# Patient Record
Sex: Male | Born: 1950 | Race: White | Hispanic: No | Marital: Married | Smoking: Current every day smoker
Health system: Southern US, Community
[De-identification: ages and names within clinical notes are randomized; demographics above are authoritative.]

## PROBLEM LIST (undated history)

## (undated) DIAGNOSIS — A0472 Enterocolitis due to Clostridium difficile, not specified as recurrent: Secondary | ICD-10-CM

## (undated) DIAGNOSIS — Z720 Tobacco use: Secondary | ICD-10-CM

## (undated) DIAGNOSIS — F1020 Alcohol dependence, uncomplicated: Secondary | ICD-10-CM

## (undated) HISTORY — PX: APPENDECTOMY: SHX54

## (undated) HISTORY — PX: ABDOMINAL SURGERY: SHX537

## (undated) HISTORY — PX: HERNIA REPAIR: SHX51

---

## 2012-11-01 ENCOUNTER — Encounter (HOSPITAL_COMMUNITY): Payer: Self-pay | Admitting: Emergency Medicine

## 2012-11-01 ENCOUNTER — Inpatient Hospital Stay (HOSPITAL_COMMUNITY): Payer: Self-pay

## 2012-11-01 ENCOUNTER — Inpatient Hospital Stay (HOSPITAL_COMMUNITY)
Admission: EM | Admit: 2012-11-01 | Discharge: 2012-11-03 | DRG: 372 | Disposition: A | Discharge status: Home / Self Care | Payer: MEDICAID | Attending: Internal Medicine | Admitting: Internal Medicine

## 2012-11-01 DIAGNOSIS — F17200 Nicotine dependence, unspecified, uncomplicated: Secondary | ICD-10-CM | POA: Diagnosis present

## 2012-11-01 DIAGNOSIS — F1020 Alcohol dependence, uncomplicated: Secondary | ICD-10-CM

## 2012-11-01 DIAGNOSIS — R197 Diarrhea, unspecified: Secondary | ICD-10-CM

## 2012-11-01 DIAGNOSIS — A0472 Enterocolitis due to Clostridium difficile, not specified as recurrent: Principal | ICD-10-CM

## 2012-11-01 DIAGNOSIS — E869 Volume depletion, unspecified: Secondary | ICD-10-CM | POA: Diagnosis present

## 2012-11-01 DIAGNOSIS — Z79899 Other long term (current) drug therapy: Secondary | ICD-10-CM

## 2012-11-01 DIAGNOSIS — D72829 Elevated white blood cell count, unspecified: Secondary | ICD-10-CM

## 2012-11-01 DIAGNOSIS — E860 Dehydration: Secondary | ICD-10-CM

## 2012-11-01 DIAGNOSIS — I959 Hypotension, unspecified: Secondary | ICD-10-CM

## 2012-11-01 DIAGNOSIS — Z720 Tobacco use: Secondary | ICD-10-CM | POA: Diagnosis present

## 2012-11-01 DIAGNOSIS — E871 Hypo-osmolality and hyponatremia: Secondary | ICD-10-CM

## 2012-11-01 HISTORY — DX: Alcohol dependence, uncomplicated: F10.20

## 2012-11-01 HISTORY — DX: Tobacco use: Z72.0

## 2012-11-01 LAB — CBC WITH DIFFERENTIAL/PLATELET
Basophils Absolute: 0 10*3/uL (ref 0.0–0.1)
Eosinophils Relative: 0 % (ref 0–5)
HCT: 41.7 % (ref 39.0–52.0)
Hemoglobin: 14.7 g/dL (ref 13.0–17.0)
Lymphocytes Relative: 5 % — ABNORMAL LOW (ref 12–46)
Lymphs Abs: 1.1 10*3/uL (ref 0.7–4.0)
MCV: 93.3 fL (ref 78.0–100.0)
Monocytes Absolute: 1.9 10*3/uL — ABNORMAL HIGH (ref 0.1–1.0)
Neutro Abs: 21.5 10*3/uL — ABNORMAL HIGH (ref 1.7–7.7)
RBC: 4.47 MIL/uL (ref 4.22–5.81)
RDW: 12 % (ref 11.5–15.5)
WBC: 24.6 10*3/uL — ABNORMAL HIGH (ref 4.0–10.5)

## 2012-11-01 LAB — COMPREHENSIVE METABOLIC PANEL
ALT: 10 U/L (ref 0–53)
AST: 22 U/L (ref 0–37)
CO2: 22 mEq/L (ref 19–32)
Chloride: 89 mEq/L — ABNORMAL LOW (ref 96–112)
Creatinine, Ser: 0.85 mg/dL (ref 0.50–1.35)
GFR calc Af Amer: >90 mL/min (ref >90)
GFR calc non Af Amer: >90 mL/min (ref >90)
Glucose, Bld: 129 mg/dL — ABNORMAL HIGH (ref 70–99)
Total Bilirubin: 0.3 mg/dL (ref 0.3–1.2)

## 2012-11-01 LAB — CREATININE, SERUM
Creatinine, Ser: 0.73 mg/dL (ref 0.50–1.35)
GFR calc non Af Amer: >90 mL/min (ref >90)

## 2012-11-01 LAB — URINALYSIS, ROUTINE W REFLEX MICROSCOPIC
Glucose, UA: NEGATIVE mg/dL
Hgb urine dipstick: NEGATIVE
Leukocytes, UA: NEGATIVE
pH: 6 (ref 5.0–8.0)

## 2012-11-01 LAB — CBC
Hemoglobin: 12.8 g/dL — ABNORMAL LOW (ref 13.0–17.0)
Platelets: 221 10*3/uL (ref 150–400)
RBC: 3.86 MIL/uL — ABNORMAL LOW (ref 4.22–5.81)

## 2012-11-01 MED ORDER — LORAZEPAM 1 MG PO TABS
1.0000 mg | ORAL_TABLET | Freq: Four times a day (QID) | ORAL | Status: DC | PRN
  Administered 2012-11-02: 1 mg via ORAL
  Filled 2012-11-01: qty 1

## 2012-11-01 MED ORDER — ONDANSETRON HCL 4 MG PO TABS: 4.0000 mg | ORAL_TABLET | Freq: Four times a day (QID) | ORAL | Status: DC | PRN

## 2012-11-01 MED ORDER — THIAMINE HCL 100 MG/ML IJ SOLN
100.0000 mg | Freq: Every day | INTRAMUSCULAR | Status: DC
  Filled 2012-11-01 (×2): qty 1

## 2012-11-01 MED ORDER — ADULT MULTIVITAMIN W/MINERALS CH
1.0000 | ORAL_TABLET | Freq: Every day | ORAL | Status: DC
  Administered 2012-11-01 – 2012-11-03 (×3): 1 via ORAL
  Filled 2012-11-01 (×3): qty 1

## 2012-11-01 MED ORDER — SODIUM CHLORIDE 0.9 % IV BOLUS (SEPSIS)
1000.0000 mL | Freq: Once | INTRAVENOUS | Status: AC
  Administered 2012-11-01: 1000 mL via INTRAVENOUS

## 2012-11-01 MED ORDER — FOLIC ACID 1 MG PO TABS
1.0000 mg | ORAL_TABLET | Freq: Every day | ORAL | Status: DC
  Administered 2012-11-01 – 2012-11-03 (×3): 1 mg via ORAL
  Filled 2012-11-01 (×3): qty 1

## 2012-11-01 MED ORDER — SODIUM CHLORIDE 0.9 % IV SOLN: INTRAVENOUS | Status: DC

## 2012-11-01 MED ORDER — THIAMINE HCL 100 MG/ML IJ SOLN: Freq: Once | INTRAVENOUS | Status: DC

## 2012-11-01 MED ORDER — ONDANSETRON HCL 4 MG/2ML IJ SOLN: 4.0000 mg | Freq: Four times a day (QID) | INTRAMUSCULAR | Status: DC | PRN

## 2012-11-01 MED ORDER — LORAZEPAM 1 MG PO TABS: 0.0000 mg | ORAL_TABLET | Freq: Two times a day (BID) | ORAL | Status: DC

## 2012-11-01 MED ORDER — ACETAMINOPHEN 650 MG RE SUPP: 650.0000 mg | Freq: Four times a day (QID) | RECTAL | Status: DC | PRN

## 2012-11-01 MED ORDER — METRONIDAZOLE 500 MG PO TABS
500.0000 mg | ORAL_TABLET | Freq: Three times a day (TID) | ORAL | Status: DC
  Administered 2012-11-01 – 2012-11-03 (×7): 500 mg via ORAL
  Filled 2012-11-01 (×9): qty 1

## 2012-11-01 MED ORDER — VITAMIN B-1 100 MG PO TABS
100.0000 mg | ORAL_TABLET | Freq: Every day | ORAL | Status: DC
  Administered 2012-11-01 – 2012-11-03 (×3): 100 mg via ORAL
  Filled 2012-11-01 (×3): qty 1

## 2012-11-01 MED ORDER — ACETAMINOPHEN 325 MG PO TABS: 650.0000 mg | ORAL_TABLET | Freq: Four times a day (QID) | ORAL | Status: DC | PRN

## 2012-11-01 MED ORDER — SACCHAROMYCES BOULARDII 250 MG PO CAPS
250.0000 mg | ORAL_CAPSULE | Freq: Two times a day (BID) | ORAL | Status: DC
  Administered 2012-11-01 – 2012-11-03 (×4): 250 mg via ORAL
  Filled 2012-11-01 (×6): qty 1

## 2012-11-01 MED ORDER — CIPROFLOXACIN IN D5W 400 MG/200ML IV SOLN
400.0000 mg | Freq: Two times a day (BID) | INTRAVENOUS | Status: DC
  Administered 2012-11-01 – 2012-11-02 (×2): 400 mg via INTRAVENOUS
  Filled 2012-11-01 (×2): qty 200

## 2012-11-01 MED ORDER — ENOXAPARIN SODIUM 40 MG/0.4ML ~~LOC~~ SOLN
40.0000 mg | SUBCUTANEOUS | Status: DC
  Administered 2012-11-01 – 2012-11-02 (×2): 40 mg via SUBCUTANEOUS
  Filled 2012-11-01 (×4): qty 0.4

## 2012-11-01 MED ORDER — SODIUM CHLORIDE 0.9 % IV SOLN
INTRAVENOUS | Status: DC
  Administered 2012-11-01: 13:00:00 via INTRAVENOUS

## 2012-11-01 MED ORDER — LORAZEPAM 2 MG/ML IJ SOLN: 1.0000 mg | Freq: Four times a day (QID) | INTRAMUSCULAR | Status: DC | PRN

## 2012-11-01 MED ORDER — OXYCODONE HCL 5 MG PO TABS: 5.0000 mg | ORAL_TABLET | ORAL | Status: DC | PRN

## 2012-11-01 MED ORDER — NICOTINE 21 MG/24HR TD PT24
21.0000 mg | MEDICATED_PATCH | Freq: Every day | TRANSDERMAL | Status: DC
  Administered 2012-11-01 – 2012-11-03 (×3): 21 mg via TRANSDERMAL
  Filled 2012-11-01 (×3): qty 1

## 2012-11-01 MED ORDER — LORAZEPAM 1 MG PO TABS
0.0000 mg | ORAL_TABLET | Freq: Four times a day (QID) | ORAL | Status: AC
  Administered 2012-11-01: 2 mg via ORAL
  Administered 2012-11-02: 4 mg via ORAL
  Filled 2012-11-01: qty 4
  Filled 2012-11-01: qty 2

## 2012-11-01 NOTE — H&P (Signed)
[facility] History and Physical  Kevin Shea WUJ:[id number] DOB: October 16, 1950 DOA: 11/01/2012  Referring physician: Dr Denton Lank PCP: No primary provider on file.  Specialists: None  Chief Complaint: Diarrhea  HPI: Kevin Shea is a 62 y.o. male with no significant past medical history except history of tobacco dependence who presents to the ED with an 11 day history of copious numerous amounts of diarrhea over 10 stools per day. Patient states 2 weeks prior to admission had upper rest her symptoms including fevers chills myalgias cough runny nose and that he may have had the flu also developed diarrhea at that time. Patient stated that his upper respiratory symptoms subsequently resolved however diarrhea has been continuous and copious. Patient describes the diarrhea as watery loose stools, malodourous, greenish, nonbloody. Patient does endorse subjective fevers, chills, dehydration, generalized weakness. Patient denies any chest pain, no shortness of breath, no nausea, no vomiting, no constipation, no abdominal pain, no dysuria. Patient stated that prior to onset of symptoms approximately 2 weeks prior to admission had some right-sided jaw swelling took 2 days worth of antibiotics that was given to him by a relative. Patient states that right-sided jaw swelling subsequently resolved. Due to patient's ongoing diarrhea he presented to the ED. In the ED comprehensive metabolic profile obtained these show a sodium of 125 a glucose of 129 albumin level of 3.2 otherwise was within normal limits. CBC had a white count of 24.6 otherwise was within normal limits. Patient also noted to have borderline blood pressures in the ED. Stool studies were ordered and are pending. We were called to admit the patient for further evaluation and management.    Review of Systems: The patient denies anorexia, fever, weight loss,, vision loss, decreased hearing, hoarseness, chest pain, syncope, dyspnea on exertion,  peripheral edema, balance deficits, hemoptysis, abdominal pain, melena, hematochezia, severe indigestion/heartburn, hematuria, incontinence, genital sores, muscle weakness, suspicious skin lesions, transient blindness, difficulty walking, depression, unusual weight change, abnormal bleeding, enlarged lymph nodes, angioedema, and breast masses.    Past Medical History  Diagnosis Date  . Tobacco abuse 11/01/2012  . EtOH dependence 11/01/2012   Past Surgical History  Procedure Date  . Abdominal surgery   . Hernia repair    Social History:  reports that he has been smoking Cigarettes.  He has been smoking about 1.5 packs per day. His smokeless tobacco use includes Chew. He reports that he drinks about 50.4 ounces of alcohol per week. He reports that he uses illicit drugs (Marijuana) about 3 times per week.  No Known Allergies  History reviewed. No pertinent family history.   Prior to Admission medications   Medication Sig Start Date End Date Taking? Authorizing Provider  loperamide (IMODIUM A-D) 2 MG tablet Take 2 mg by mouth 4 (four) times daily as needed. DIARRHEA   Yes Historical Provider, MD   Physical Exam: Filed Vitals:   11/01/12 0758 11/01/12 0811  BP: 98/69   Pulse: 105   Temp: 98.3 F (36.8 C)   TempSrc: Oral   Resp: 17   Weight:  55.509 kg (122 lb 6 oz)  SpO2: 95%      General:  Thin. Well-developed well-nourished in no acute cardiopulmonary distress.  Eyes: Pupils equal round and reactive to light and accommodation. Extraocular movements intact.  ENT:  Oropharynx is clear, no lesions, no exudates. Dry mucous membranes.  Neck: Supple with no lymphadenopathy.  Cardiovascular: Regular rate rhythm no murmurs rubs or gallops  Respiratory: Clear to auscultation bilaterally. No wheezing,  no crackles, no rhonchi.  Abdomen: Soft, nontender, nondistended, positive bowel sounds.  Skin: Patient noted to have some circular dry eczematous looking rash on his  back.  Musculoskeletal: 5 out of 5 bilateral pressure on discharge. 5 out of 5 bilateral lower extremity strength.  Psychiatric: Normal mood. Normal affect. Good insight. Good judgment.  Neurologic: Alert and oriented x3. Cranial nerves II through XII are grossly intact. No focal deficits. Sensation is intact. Gait not tested secondary to safety.  Labs on Admission:  Basic Metabolic Panel:  Lab 11/01/12 1610  NA 125*  K 3.7  CL 89*  CO2 22  GLUCOSE 129*  BUN 18  CREATININE 0.85  CALCIUM 9.0  MG --  PHOS --   Liver Function Tests:  Lab 11/01/12 0839  AST 22  ALT 10  ALKPHOS 65  BILITOT 0.3  PROT 7.2  ALBUMIN 3.2*   No results found for this basename: LIPASE:5,AMYLASE:5 in the last 168 hours No results found for this basename: AMMONIA:5 in the last 168 hours CBC:  Lab 11/01/12 0839  WBC 24.6*  NEUTROABS 21.5*  HGB 14.7  HCT 41.7  MCV 93.3  PLT 255   Cardiac Enzymes: No results found for this basename: CKTOTAL:5,CKMB:5,CKMBINDEX:5,TROPONINI:5 in the last 168 hours  BNP (last 3 results) No results found for this basename: PROBNP:3 in the last 8760 hours CBG: No results found for this basename: GLUCAP:5 in the last 168 hours  Radiological Exams on Admission: No results found.  EKG: None  Assessment/Plan Principal Problem:  *Diarrhea Active Problems:  Hyponatremia  Leukocytosis  Dehydration  Hypotension  Tobacco abuse  EtOH dependence  #1 diarrhea Patient is presenting with copious amounts of diarrhea x11 days which is malodorous, watery, greenish. 2 weeks prior to admission patient self medicated with antibiotics for right shoulder pain. Patient also noted to have some fever and chills at home. Patient does have a leukocytosis with a white count of 24.6. Will check blood cultures x2. Will check stool for C. difficile PCR, ova and parasites, enteric pathogens. Will hydrate with IV fluids. We'll place on IV ciprofloxacin and oral Flagyl while awaiting  stool culture results. Supportive care. Follow.  #2 hyponatremia Likely secondary to hypovolemic hyponatremia secondary to dehydration and GI losses versus beer potomania. Patient's chloride levels are low. Patient's blood pressure was borderline on presentation. Will check a urine sodium. Check a urine creatinine. Check a urine osmolality. Check a serum osmolality. Check a chest x-ray. Check a TSH.-Up with IV fluids. Follow.  #3 dehydration Secondary to GI losses. IV fluids.  #4 leukocytosis Likely secondary to problem #1. C. difficile PCR is pending. Stool cultures and stool for ova and parasites as well as enteric pathogens are pending. Will check blood cultures x2. Will check a chest x-ray. Urinalysis is pending. Will place empirically on IV ciprofloxacin and oral Flagyl. Follow.  #5 tobacco abuse Tobacco cessation. We'll place on a nicotine patch.  #6 alcohol dependence Patient Francetta Found were from 6-12 pack of beer per day. Will place patient on Ativan withdrawal protocol. Monitor for DTs. Place on folic acid daily as well as thiamine daily.  #7 borderline hypotension Likely secondary to volume depletion secondary to dehydration versus infectious etiology. Will check blood cultures x2. Stool studies are pending. Urinalysis is pending. Will check a lactic acid level. Blood pressure has responded to IV fluids. Hydrated with IV fluids. We'll place empirically on IV ciprofloxacin and oral Flagyl. Follow.  #8 prophylaxis Lovenox for DVT prophylaxis.  Code Status:  Full Family Communication: Updated patient and wife at bedside Disposition Plan: Admit to MedSurg  Time spent: 65 mins  [facility] Pager [phone]  If 7PM-7AM, please contact night-coverage [email or web address] Password [facility] 11/01/2012, 12:07 PM

## 2012-11-01 NOTE — ED Notes (Signed)
Pt states has the flu several wks ago and started having diarrhea following. States is liquid green, bile w/o any stomach pain or cramping. Pt states feels like he has to go all the time. Decreased appetite d/t this and tried OTC Imodium w/o any improvement.

## 2012-11-01 NOTE — ED Notes (Signed)
Pt provided with Ginger ale per order

## 2012-11-01 NOTE — ED Notes (Signed)
Pt aware of the need for stool sample. Unable to provide at this time

## 2012-11-01 NOTE — ED Provider Notes (Addendum)
History     CSN: [id number]  Arrival date & time 11/01/12  8295   First MD Initiated Contact with Patient 11/01/12 [phone]      Chief Complaint  Patient presents with  . Diarrhea    (Consider location/radiation/quality/duration/timing/severity/associated sxs/prior treatment) Patient is a 62 y.o. male presenting with diarrhea. The history is provided by the patient.  Diarrhea The primary symptoms include diarrhea. Primary symptoms do not include fever, abdominal pain, vomiting, dysuria or rash.  The illness does not include back pain.  pt with diarrhea for past 1-1.5 weeks. States at onset thought had 'stomach flu'. Had fever, body aches and diarrhea.  Fever and body aches have resolved, but diarrhea persists. 5-10 watery, sl greenish stools per day. No bloody bms. No abdominal pain or distension. No n/v. Normal appetite. No chills/sweats. No faintness or lightheadedness. No known ill contacts, recent travel, or bad food ingestion. Was on unspec abx for facial abscess earlier in month. No hx c diff.     History reviewed. No pertinent past medical history.  Past Surgical History  Procedure Date  . Abdominal surgery   . Hernia repair     No family history on file.  History  Substance Use Topics  . Smoking status: Current Every Day Smoker -- 1.5 packs/day    Types: Cigarettes  . Smokeless tobacco: Current User    Types: Chew  . Alcohol Use: 50.4 oz/week    84 Cans of beer per week     Comment: 6 pk 12 oz cans daily      Review of Systems  Constitutional: Negative for fever.  HENT: Negative for sore throat and neck pain.   Eyes: Negative for redness.  Respiratory: Negative for shortness of breath.   Cardiovascular: Negative for chest pain.  Gastrointestinal: Positive for diarrhea. Negative for vomiting and abdominal pain.  Genitourinary: Negative for dysuria and flank pain.  Musculoskeletal: Negative for back pain.  Skin: Negative for rash.  Neurological: Negative for  headaches.  Hematological: Does not bruise/bleed easily.  Psychiatric/Behavioral: Negative for confusion.    Allergies  Review of patient's allergies indicates not on file.  Home Medications  No current outpatient prescriptions on file.  BP 98/69  Pulse 105  Temp 98.3 F (36.8 C) (Oral)  Resp 17  Wt 122 lb 6 oz (55.509 kg)  SpO2 95%  Physical Exam  Nursing note and vitals reviewed. Constitutional: He is oriented to person, place, and time. He appears well-developed and well-nourished. No distress.  HENT:  Head: Atraumatic.  Nose: Nose normal.  Mouth/Throat: Oropharynx is clear and moist.  Eyes: Conjunctivae normal are normal. Pupils are equal, round, and reactive to light. No scleral icterus.  Neck: Neck supple. No tracheal deviation present. No thyromegaly present.  Cardiovascular: Normal rate, regular rhythm, normal heart sounds and intact distal pulses.   Pulmonary/Chest: Effort normal and breath sounds normal. No accessory muscle usage. No respiratory distress.  Abdominal: Soft. Bowel sounds are normal. He exhibits no distension and no mass. There is no tenderness. There is no rebound and no guarding.  Musculoskeletal: Normal range of motion. He exhibits no edema and no tenderness.  Neurological: He is alert and oriented to person, place, and time.  Skin: Skin is warm and dry. He is not diaphoretic.  Psychiatric: He has a normal mood and affect.    ED Course  Procedures (including critical care time)   Results for orders placed during the hospital encounter of 11/01/12  CBC WITH DIFFERENTIAL  Component Value Range   WBC 24.6 (*) 4.0 - 10.5 K/uL   RBC 4.47  4.22 - 5.81 MIL/uL   Hemoglobin 14.7  13.0 - 17.0 g/dL   HCT 16.1  09.6 - 04.5 %   MCV 93.3  78.0 - 100.0 fL   MCH 32.9  26.0 - 34.0 pg   MCHC 35.3  30.0 - 36.0 g/dL   RDW 40.9  81.1 - 91.4 %   Platelets 255  150 - 400 K/uL   Neutrophils Relative 87 (*) 43 - 77 %   Neutro Abs 21.5 (*) 1.7 - 7.7 K/uL    Lymphocytes Relative 5 (*) 12 - 46 %   Lymphs Abs 1.1  0.7 - 4.0 K/uL   Monocytes Relative 8  3 - 12 %   Monocytes Absolute 1.9 (*) 0.1 - 1.0 K/uL   Eosinophils Relative 0  0 - 5 %   Eosinophils Absolute 0.0  0.0 - 0.7 K/uL   Basophils Relative 0  0 - 1 %   Basophils Absolute 0.0  0.0 - 0.1 K/uL  COMPREHENSIVE METABOLIC PANEL      Component Value Range   Sodium 125 (*) 135 - 145 mEq/L   Potassium 3.7  3.5 - 5.1 mEq/L   Chloride 89 (*) 96 - 112 mEq/L   CO2 22  19 - 32 mEq/L   Glucose, Bld 129 (*) 70 - 99 mg/dL   BUN 18  6 - 23 mg/dL   Creatinine, Ser 7.82  0.50 - 1.35 mg/dL   Calcium 9.0  8.4 - 95.6 mg/dL   Total Protein 7.2  6.0 - 8.3 g/dL   Albumin 3.2 (*) 3.5 - 5.2 g/dL   AST 22  0 - 37 U/L   ALT 10  0 - 53 U/L   Alkaline Phosphatase 65  39 - 117 U/L   Total Bilirubin 0.3  0.3 - 1.2 mg/dL   GFR calc non Af Amer >90  >90 mL/min   GFR calc Af Amer >90  >90 mL/min      MDM  Po fluids. Labs.  Reviewed nursing notes and prior charts for additional history.    Pt with hyponatremia, na 125.   Additional ns iv.  Wbc elevated. abd soft nt.   cdiff and stool cx pending.  Given hyponatremia, dehydration, will admit.   [facility] obs temp orders, team 5, medsurg, 5 east preferred    Suzi Roots, MD 11/01/12 1051  Suzi Roots, MD 11/01/12 1115

## 2012-11-02 DIAGNOSIS — A0472 Enterocolitis due to Clostridium difficile, not specified as recurrent: Secondary | ICD-10-CM

## 2012-11-02 DIAGNOSIS — E860 Dehydration: Secondary | ICD-10-CM

## 2012-11-02 HISTORY — DX: Enterocolitis due to Clostridium difficile, not specified as recurrent: A04.72

## 2012-11-02 LAB — OSMOLALITY, URINE: Osmolality, Ur: 553 mOsm/kg (ref 390–1090)

## 2012-11-02 LAB — COMPREHENSIVE METABOLIC PANEL
AST: 16 U/L (ref 0–37)
Albumin: 2.2 g/dL — ABNORMAL LOW (ref 3.5–5.2)
BUN: 7 mg/dL (ref 6–23)
CO2: 24 mEq/L (ref 19–32)
Calcium: 7.7 mg/dL — ABNORMAL LOW (ref 8.4–10.5)
Chloride: 100 mEq/L (ref 96–112)
Creatinine, Ser: 0.66 mg/dL (ref 0.50–1.35)
GFR calc non Af Amer: >90 mL/min (ref >90)
Total Bilirubin: 0.2 mg/dL — ABNORMAL LOW (ref 0.3–1.2)

## 2012-11-02 LAB — CREATININE, URINE, RANDOM: Creatinine, Urine: 121.2 mg/dL

## 2012-11-02 LAB — CBC WITH DIFFERENTIAL/PLATELET
Hemoglobin: 11.9 g/dL — ABNORMAL LOW (ref 13.0–17.0)
Lymphocytes Relative: 7 % — ABNORMAL LOW (ref 12–46)
Lymphs Abs: 1 10*3/uL (ref 0.7–4.0)
Monocytes Relative: 8 % (ref 3–12)
Neutrophils Relative %: 83 % — ABNORMAL HIGH (ref 43–77)
Platelets: 203 10*3/uL (ref 150–400)
RBC: 3.66 MIL/uL — ABNORMAL LOW (ref 4.22–5.81)
WBC: 13.3 10*3/uL — ABNORMAL HIGH (ref 4.0–10.5)

## 2012-11-02 LAB — URINE CULTURE: Colony Count: 3000

## 2012-11-02 LAB — LACTIC ACID, PLASMA: Lactic Acid, Venous: <0.2 mmol/L — ABNORMAL LOW (ref 0.5–2.2)

## 2012-11-02 LAB — GI PATHOGEN PANEL BY PCR, STOOL
E coli (STEC): NEGATIVE
Norovirus GI/GII: NEGATIVE
Rotavirus A by PCR: NEGATIVE
Salmonella by PCR: NEGATIVE
Shigella by PCR: NEGATIVE

## 2012-11-02 LAB — SODIUM, URINE, RANDOM: Sodium, Ur: 59 mEq/L

## 2012-11-02 LAB — TSH: TSH: 1.775 u[IU]/mL (ref 0.350–4.500)

## 2012-11-02 NOTE — Progress Notes (Signed)
[facility] PROGRESS NOTE  Kevin Shea RUE:[id number] DOB: 30-Jun-1951 DOA: 11/01/2012 PCP: No primary provider on file.  Assessment/Plan:  #1 C. difficile colitis Patient presented with copious amounts of diarrhea x11 days. C. difficile PCR was positive. Patient states frequency of stools have decreased. Patient is currently afebrile. Leukocytosis is trending down. Continue empiric oral Flagyl and florastor. Follow.  #2 diarrhea Secondary to problem #1.  #3 leukocytosis Secondary to problem #1. Chest x-ray was negative. Urinalysis was unremarkable. Leukocytosis is trending down. Continue Flagyl. Follow.  #4 dehydration IV fluids.  #5 hyponatremia Likely secondary to hypovolemic hyponatremia secondary to saturation in GI losses versus B. a pulpotomy. Hyponatremia is improving. Continue IV fluids. Follow.  #6 tobacco abuse Nicotine patch.  #7 alcohol dependence Continue Ativan withdrawal protocol. Continue folic acid. Continue thiamine.  #8 borderline hypotension Likely secondary to volume depletion versus colitis. Blood cultures are pending. Stool studies with a positive C. difficile PCR. Urinalysis is negative. Chest x-ray is negative. Continue IV fluids. Continue Flagyl.  #9 prophylaxis Lovenox for DVT prophylaxis.  Code Status: Full Family Communication: Admitted patient no family at bedside. Disposition Plan: Home when medically stable   Consultants:  None  Procedures:  Chest x-ray 11/01/2012  Antibiotics:  IV Cipro 11/01/2012 to 11/02/2012  Oral Flagyl 11/01/2012  HPI/Subjective: Patient states he's feeling better. Patient states had 3-4 loose stools this morning. Patient states stools less frequent.  Objective: Filed Vitals:   11/01/12 1422 11/01/12 1744 11/01/12 2200 11/02/12 0600  BP: 134/68 122/72 98/86 98/58   Pulse: 74 75 72 66  Temp: 98.1 F (36.7 C)  98 F (36.7 C) 97.7 F (36.5 C)  TempSrc: Oral  Oral Oral  Resp:   18 18  Height: 5'  10" (1.778 m)     Weight: 57.1 kg (125 lb 14.1 oz)   57.607 kg (127 lb)  SpO2: 97%  97% 97%    Intake/Output Summary (Last 24 hours) at 11/02/12 1410 Last data filed at 11/02/12 1056  Gross per 24 hour  Intake   1740 ml  Output      0 ml  Net   1740 ml   Filed Weights   11/01/12 0811 11/01/12 1422 11/02/12 0600  Weight: 55.509 kg (122 lb 6 oz) 57.1 kg (125 lb 14.1 oz) 57.607 kg (127 lb)    Exam:   General:  nad  Cardiovascular: RRR. No lower extremity edema  Respiratory: CTAB  Abdomen: Soft, nontender, nondistended, positive bowel sounds.  Data Reviewed: Basic Metabolic Panel:  Lab 11/02/12 1478 11/01/12 1510 11/01/12 0839  NA 131* -- 125*  K 3.9 -- 3.7  CL 100 -- 89*  CO2 24 -- 22  GLUCOSE 88 -- 129*  BUN 7 -- 18  CREATININE 0.66 0.73 0.85  CALCIUM 7.7* -- 9.0  MG -- 2.1 --  PHOS -- 2.2* --   Liver Function Tests:  Lab 11/02/12 0510 11/01/12 0839  AST 16 22  ALT 7 10  ALKPHOS 44 65  BILITOT 0.2* 0.3  PROT 5.2* 7.2  ALBUMIN 2.2* 3.2*   No results found for this basename: LIPASE:5,AMYLASE:5 in the last 168 hours No results found for this basename: AMMONIA:5 in the last 168 hours CBC:  Lab 11/02/12 0510 11/01/12 1510 11/01/12 0839  WBC 13.3* 19.2* 24.6*  NEUTROABS 11.1* -- 21.5*  HGB 11.9* 12.8* 14.7  HCT 34.8* 36.4* 41.7  MCV 95.1 94.3 93.3  PLT 203 221 255   Cardiac Enzymes: No results found for this basename: CKTOTAL:5,CKMB:5,CKMBINDEX:5,TROPONINI:5  in the last 168 hours BNP (last 3 results) No results found for this basename: PROBNP:3 in the last 8760 hours CBG: No results found for this basename: GLUCAP:5 in the last 168 hours  Recent Results (from the past 240 hour(s))  CLOSTRIDIUM DIFFICILE BY PCR     Status: Abnormal   Collection Time   11/01/12  9:53 AM      Component Value Range Status Comment   C difficile by pcr POSITIVE (*) NEGATIVE Final      Studies: X-ray Chest Pa And Lateral   11/01/2012  *RADIOLOGY REPORT*  Clinical Data:  Leukocytosis.  CHEST - 2 VIEW  Comparison: None.  Findings: The heart size is normal.  Mild emphysematous changes are present.  No focal airspace disease is evident.  The visualized soft tissues and bony thorax are unremarkable.  IMPRESSION:  1.  No acute cardiopulmonary disease. 2.  Emphysema.   Original Report Authenticated By: Marin Roberts, M.D.     Scheduled Meds:    . enoxaparin (LOVENOX) injection  40 mg Subcutaneous Q24H  . folic acid  1 mg Oral Daily  . LORazepam  0-4 mg Oral Q6H   Followed by  . LORazepam  0-4 mg Oral Q12H  . metroNIDAZOLE  500 mg Oral Q8H  . multivitamin with minerals  1 tablet Oral Daily  . nicotine  21 mg Transdermal Daily  . saccharomyces boulardii  250 mg Oral BID  . thiamine  100 mg Oral Daily   Or  . thiamine  100 mg Intravenous Daily   Continuous Infusions:    . sodium chloride 125 mL/hr at 11/01/12 1325    Principal Problem:  *C. difficile colitis Active Problems:  Diarrhea  Hyponatremia  Leukocytosis  Dehydration  Hypotension  Tobacco abuse  EtOH dependence    Time spent: > 35 mins    [facility]  [facility] Pager [phone]. If 8PM-8AM, please contact night-coverage at [email or web address], password [facility] 11/02/2012, 2:10 PM  LOS: 1 day

## 2012-11-02 NOTE — Evaluation (Signed)
Physical Therapy Evaluation Patient Details Name: Kevin Shea MRN: [id number] DOB: 07-08-1951 Today's Date: 11/02/2012 Time: 5621-3086 PT Time Calculation (min): 13 min  PT Assessment / Plan / Recommendation Clinical Impression  Pt. admitted w/ several days of diarrhea, ion ETOH withdrawal protocol. Pt is independent and requires no further acute PT at this time.    PT Assessment  Patent does not need any further PT services    Follow Up Recommendations  No PT follow up    Does the patient have the potential to tolerate intense rehabilitation      Barriers to Discharge        Equipment Recommendations  None recommended by PT    Recommendations for Other Services     Frequency      Precautions / Restrictions Precautions Precautions: None   Pertinent Vitals/Pain       Mobility  Bed Mobility Bed Mobility: Supine to Sit Supine to Sit: 7: Independent Transfers Sit to Stand: 7: Independent Stand to Sit: 7: Independent Ambulation/Gait Ambulation/Gait Assistance: 7: Independent Ambulation Distance (Feet): 350 Feet Assistive device: None Ambulation/Gait Assistance Details: no difficulties. Gait Pattern: Step-through pattern Gait velocity: normal when encouraged.    Exercises     PT Diagnosis:    PT Problem List:   PT Treatment Interventions:     PT Goals    Visit Information  Last PT Received On: 11/02/12 Assistance Needed: +1    Subjective Data  Subjective: I am doing ok if i can stay out of the bathroom. Patient Stated Goal: to go home   Prior Functioning  Home Living Lives With: Family Type of Home: House Home Access: Level entry Home Layout: One level Bathroom Shower/Tub: Engineer, manufacturing systems: Standard Home Adaptive Equipment: Walker - rolling;Straight cane;Bedside commode/3-in-1 Prior Function Level of Independence: Independent Driving: Yes Vocation: Full time employment Communication Communication: No difficulties Dominant  Hand: Right    Cognition  Cognition Overall Cognitive Status: Appears within functional limits for tasks assessed/performed Arousal/Alertness: Awake/alert Orientation Level: Appears intact for tasks assessed Behavior During Session: [facility] for tasks performed    Extremity/Trunk Assessment Right Upper Extremity Assessment RUE ROM/Strength/Tone: [facility] for tasks assessed Left Upper Extremity Assessment LUE ROM/Strength/Tone: WFL for tasks assessed Right Lower Extremity Assessment RLE ROM/Strength/Tone: WFL for tasks assessed RLE Sensation: WFL - Light Touch Left Lower Extremity Assessment LLE ROM/Strength/Tone: WFL for tasks assessed LLE Sensation: WFL - Light Touch   Balance High Level Balance High Level Balance Activites: Direction changes;Turns;Sudden stops;Head turns High Level Balance Comments: Pt appears steady with all mobility. No LOB noted.  End of Session PT - End of Session Activity Tolerance: Patient tolerated treatment well Patient left: in chair;with call bell/phone within reach Nurse Communication: Mobility status  GP     Rada Hay 11/02/2012, 11:02 AM  [phone]

## 2012-11-02 NOTE — Progress Notes (Addendum)
Clinical Social Work Department BRIEF PSYCHOSOCIAL ASSESSMENT 11/02/2012  Patient:  Kevin Shea, Kevin Shea     Account Number:  [id number]     Admit date:  11/01/2012  Clinical Social Worker:  Doroteo Glassman  Date/Time:  11/02/2012 02:36 PM  Referred by:  Physician  Date Referred:  11/02/2012 Referred for  Substance Abuse   Other Referral:   Interview type:  Patient Other interview type:   daughter present at bedside    PSYCHOSOCIAL DATA Living Status:  WIFE Admitted from facility:   Level of care:   Primary support name:  Vicky Primary support relationship to patient:  SPOUSE Degree of support available:   adequate    CURRENT CONCERNS Current Concerns  Substance Abuse   Other Concerns:    SOCIAL WORK ASSESSMENT / PLAN Met with Pt and daughter at bedside.    CSW discussed with Pt the concerns regarding his ETOH consumption.  Pt stated that he wasn't interested in seeking any form of tx at this time.  He was receptive, however, to information from CSW re: various tx levels and options when he d/cs.    Pt's daughter expressed her concern with regard to her father's drinking, stating, "You drink a lot, dad."  Pt shook his head "yes."    CSW to provide Pt with inpt, outpt, IOP and AA information upon d/c.    CSW thanked Pt and his daughter for their time.   Assessment/plan status:  Psychosocial Support/Ongoing Assessment of Needs Other assessment/ plan:   Information/referral to community resources:   Will give SA information at d/c, per Pt's request.    PATIENT'S/FAMILY'S RESPONSE TO PLAN OF CARE: Pt and daughter thanked CSW for time and assistance.   CSW to continue to follow.  Providence Crosby, LCSWA Clinical Social Work [phone]

## 2012-11-02 NOTE — Evaluation (Signed)
Occupational Therapy Evaluation Patient Details Name: Kevin Shea MRN: [id number] DOB: [birth year] Today's Date: 11/02/2012 Time: 4098-1191 OT Time Calculation (min): 14 min  OT Assessment / Plan / Recommendation Clinical Impression  62 yo male admitted with diarrhea, ETOH w/drawl. Pt is indpendent with all adls. Presents with no further needs at this time.    OT Assessment  Patient does not need any further OT services    Follow Up Recommendations  No OT follow up    Barriers to Discharge      Equipment Recommendations  None recommended by OT    Recommendations for Other Services    Frequency       Precautions / Restrictions Precautions Precautions: None   Pertinent Vitals/Pain Pt denied pain    ADL  Eating/Feeding: Independent Where Assessed - Eating/Feeding: Chair Grooming: Wash/dry hands;Independent Where Assessed - Grooming: Unsupported standing Upper Body Bathing: Independent Where Assessed - Upper Body Bathing: Unsupported standing Lower Body Bathing: Modified independent Where Assessed - Lower Body Bathing: Unsupported sit to stand Upper Body Dressing: Independent Where Assessed - Upper Body Dressing: Unsupported standing Lower Body Dressing: Modified independent Where Assessed - Lower Body Dressing: Supported sit to stand Toilet Transfer: Independent Statistician Method: Sit to Barista: Comfort height toilet Where Assessed - Engineer, mining and Hygiene: Sit to stand from 3-in-1 or toilet    OT Diagnosis:    OT Problem List:   OT Treatment Interventions:     OT Goals    Visit Information  Last OT Received On: 11/02/12 Assistance Needed: +1 PT/OT Co-Evaluation/Treatment: Yes    Subjective Data  Subjective: I'm hoping to get out of here by tomorrow. Patient Stated Goal: Not asked.   Prior Functioning     Home Living Lives With: Family Type of Home: House Home Access: Level entry Home Layout: One  level Bathroom Shower/Tub: Engineer, manufacturing systems: Standard Home Adaptive Equipment: Walker - rolling;Straight cane;Bedside commode/3-in-1 Prior Function Level of Independence: Independent Driving: Yes Vocation: Full time employment Communication Communication: No difficulties Dominant Hand: Right         Vision/Perception     Cognition  Cognition Overall Cognitive Status: Appears within functional limits for tasks assessed/performed Arousal/Alertness: Awake/alert Orientation Level: Appears intact for tasks assessed Behavior During Session: [facility] for tasks performed    Extremity/Trunk Assessment Right Upper Extremity Assessment RUE ROM/Strength/Tone: [facility] for tasks assessed Left Upper Extremity Assessment LUE ROM/Strength/Tone: WFL for tasks assessed     Mobility Bed Mobility Bed Mobility: Supine to Sit Supine to Sit: 7: Independent Transfers Transfers: Sit to Stand;Stand to Sit Sit to Stand: 7: Independent Stand to Sit: 7: Independent     Exercise     Balance High Level Balance High Level Balance Activites: Direction changes;Turns;Sudden stops;Head turns High Level Balance Comments: Pt appears steady with all mobility. No LOB noted.   End of Session OT - End of Session Activity Tolerance: Patient tolerated treatment well Patient left: in chair;with call bell/phone within reach  GO     Ardie Dragoo A OTR/L [phone] 11/02/2012, 10:43 AM

## 2012-11-03 DIAGNOSIS — F1020 Alcohol dependence, uncomplicated: Secondary | ICD-10-CM

## 2012-11-03 LAB — BASIC METABOLIC PANEL
BUN: 3 mg/dL — ABNORMAL LOW (ref 6–23)
CO2: 22 mEq/L (ref 19–32)
Calcium: 7.7 mg/dL — ABNORMAL LOW (ref 8.4–10.5)
Chloride: 101 mEq/L (ref 96–112)
Creatinine, Ser: 0.65 mg/dL (ref 0.50–1.35)
Glucose, Bld: 98 mg/dL (ref 70–99)

## 2012-11-03 LAB — CBC
HCT: 32.3 % — ABNORMAL LOW (ref 39.0–52.0)
Hemoglobin: 11.6 g/dL — ABNORMAL LOW (ref 13.0–17.0)
MCH: 33.6 pg (ref 26.0–34.0)
MCV: 93.6 fL (ref 78.0–100.0)
RBC: 3.45 MIL/uL — ABNORMAL LOW (ref 4.22–5.81)
WBC: 9.4 10*3/uL (ref 4.0–10.5)

## 2012-11-03 MED ORDER — FOLIC ACID 1 MG PO TABS: 1.0000 mg | ORAL_TABLET | Freq: Every day | ORAL | Status: DC

## 2012-11-03 MED ORDER — METRONIDAZOLE 500 MG PO TABS: 500.0000 mg | ORAL_TABLET | Freq: Three times a day (TID) | ORAL | Status: AC

## 2012-11-03 MED ORDER — SACCHAROMYCES BOULARDII 250 MG PO CAPS: 250.0000 mg | ORAL_CAPSULE | Freq: Two times a day (BID) | ORAL | Status: DC

## 2012-11-03 MED ORDER — POTASSIUM CHLORIDE CRYS ER 20 MEQ PO TBCR
40.0000 meq | EXTENDED_RELEASE_TABLET | ORAL | Status: AC
  Administered 2012-11-03 (×2): 40 meq via ORAL
  Filled 2012-11-03 (×3): qty 2

## 2012-11-03 MED ORDER — THIAMINE HCL 100 MG PO TABS: 100.0000 mg | ORAL_TABLET | Freq: Every day | ORAL | Status: DC

## 2012-11-03 NOTE — Progress Notes (Signed)
Met with pt to discuss follow up care after hospital stay. He is uninsured and doesn't have a PCP. I provided him with a list of area providers who do take self pay patients. He stated he will look at it and decide on who to see.

## 2012-11-03 NOTE — Discharge Summary (Signed)
Physician Discharge Summary  Kevin Shea BJY:[id number] DOB: [birth year] DOA: 11/01/2012  PCP: No primary provider on file.  Admit date: 11/01/2012 Discharge date: 11/03/2012  Time spent: 65 minutes  Recommendations for Outpatient Follow-up:  1. Patient has been given information about her PCP and he would need to followup with her PCP one week post discharge. On followup basic metabolic profile need to be obtained to followup on patient's electrolytes and renal function. A CBC will also need to be obtained to followup on patient's hemoglobin and white count.  2. Patient has been given information about alcohol cessation.  Discharge Diagnoses:  Principal Problem:  *C. difficile colitis Active Problems:  Diarrhea  Hyponatremia  Leukocytosis  Dehydration  Hypotension  Tobacco abuse  EtOH dependence   Discharge Condition: Stable and improved  Diet recommendation: Regular  Filed Weights   11/01/12 1422 11/02/12 0600 11/03/12 0523  Weight: 57.1 kg (125 lb 14.1 oz) 57.607 kg (127 lb) 59.603 kg (131 lb 6.4 oz)    History of present illness:  Kevin Shea is a 62 y.o. male with no significant past medical history except history of tobacco dependence who presents to the ED with an 11 day history of copious numerous amounts of diarrhea over 10 stools per day. Patient states 2 weeks prior to admission had upper rest her symptoms including fevers chills myalgias cough runny nose and that he may have had the flu also developed diarrhea at that time. Patient stated that his upper respiratory symptoms subsequently resolved however diarrhea has been continuous and copious. Patient describes the diarrhea as watery loose stools, malodourous, greenish, nonbloody. Patient does endorse subjective fevers, chills, dehydration, generalized weakness. Patient denies any chest pain, no shortness of breath, no nausea, no vomiting, no constipation, no abdominal pain, no dysuria. Patient stated that prior to onset  of symptoms approximately 2 weeks prior to admission had some right-sided jaw swelling took 2 days worth of antibiotics that was given to him by a relative. Patient states that right-sided jaw swelling subsequently resolved. Due to patient's ongoing diarrhea he presented to the ED. In the ED comprehensive metabolic profile obtained these show a sodium of 125 a glucose of 129 albumin level of 3.2 otherwise was within normal limits. CBC had a white count of 24.6 otherwise was within normal limits. Patient also noted to have borderline blood pressures in the ED. Stool studies were ordered and are pending. We were called to admit the patient for further evaluation and management   Hospital Course:  #1 C. difficile colitis  Patient presented with copious amounts of diarrhea x11 days. Patient was placed on a MedSurg floor and started empirically on IV ciprofloxacin and oral Flagyl as well as supportive care. Stool studies were ordered. C. difficile PCR came back  positive. IV Cipro was subsequently discontinued and patient was maintained on oral Flagyl. Patient improved clinically during the hospitalization the frequency of stools decreased in the stool started to get more formed. Patient's leukocytosis improved and had resolved by day of discharge. Patient improved clinically and be discharged home in stable and improved condition on 9 more days of oral Flagyl to complete a ten-day course of therapy. Patient will followup with PCP as outpatient.  #2 diarrhea  Secondary to problem #1.  #3 leukocytosis  Secondary to problem #1. Chest x-ray was negative. Urinalysis was unremarkable. Leukocytosis is trending down on antibiotics and had resolved by day of discharge.  #4 hyponatremia  On admission patient was noted to be hyponatremic.  It was felt this was secondary to volume depletion secondary to GI losses from his diarrhea in addition to dehydration. Patient was hydrated with IV fluids his hyponatremia improved by  day of discharge. Patient will followup with PCP as outpatient.  #5 alcohol dependence  On admission patient was noted to have alcohol dependence. This was placed on atenolol withdrawal protocol. Patient was also placed on thiamine and folic acid. Patient did not have any episodes of delirium tremens during the hospitalization. Patient was given information about cessation of alcohol use. Patient was also cautioned on the use of alcohol was on Flagyl. Patient be discharged in stable condition.  #6 borderline hypotension  On admission patient was noted to have borderline blood pressure. It was felt patient's borderline blood pressure was likely secondary to volume depletion secondary to C. difficile colitis. Patient was hydrated with IV fluids his blood pressure improved. He was also placed empirically on IV ciprofloxacin x1 day and continued on oral Flagyl when C. difficile PCR came back positive. Patient's blood pressure responded and had resolved by day of discharge. Patient be discharged in stable and improved condition.   The rest of patient's chronic medical issues remained stable throughout the hospitalization and patient be discharged in stable and improved condition.     Procedures:  Chest x-ray 11/01/2012  Consultations:  None  Discharge Exam: Filed Vitals:   11/02/12 0600 11/02/12 1454 11/02/12 2130 11/03/12 0523  BP: 98/58 114/69 110/69 122/73  Pulse: 66 64 65 75  Temp: 97.7 F (36.5 C) 98.4 F (36.9 C) 98 F (36.7 C) 97.6 F (36.4 C)  TempSrc: Oral Oral Oral Oral  Resp: 18 18 16 16   Height:      Weight: 57.607 kg (127 lb)   59.603 kg (131 lb 6.4 oz)  SpO2: 97% 98% 98% 100%    General: NAD Cardiovascular: RRR Respiratory: CTAB  Discharge Instructions  Discharge Orders    Future Orders Please Complete By Expires   Diet general      Increase activity slowly      Discharge instructions      Comments:   Followup with primary care doctor in 1 week. Please  refrain from drinking while on Flagyl.       Medication List     As of 11/03/2012  3:23 PM    STOP taking these medications         loperamide 2 MG tablet   Commonly known as: IMODIUM A-D      TAKE these medications         folic acid 1 MG tablet   Commonly known as: FOLVITE   Take 1 tablet (1 mg total) by mouth daily.      metroNIDAZOLE 500 MG tablet   Commonly known as: FLAGYL   Take 1 tablet (500 mg total) by mouth every 8 (eight) hours. Take for 9 days then stop.      saccharomyces boulardii 250 MG capsule   Commonly known as: FLORASTOR   Take 1 capsule (250 mg total) by mouth 2 (two) times daily.      thiamine 100 MG tablet   Take 1 tablet (100 mg total) by mouth daily.           Follow-up Information    Schedule an appointment as soon as possible for a visit in 1 week to follow up. (Followup with PCP in 1 week.)           The results of significant diagnostics from  this hospitalization (including imaging, microbiology, ancillary and laboratory) are listed below for reference.    Significant Diagnostic Studies: X-ray Chest Pa And Lateral   11/01/2012  *RADIOLOGY REPORT*  Clinical Data: Leukocytosis.  CHEST - 2 VIEW  Comparison: None.  Findings: The heart size is normal.  Mild emphysematous changes are present.  No focal airspace disease is evident.  The visualized soft tissues and bony thorax are unremarkable.  IMPRESSION:  1.  No acute cardiopulmonary disease. 2.  Emphysema.   Original Report Authenticated By: Marin Roberts, M.D.     Microbiology: Recent Results (from the past 240 hour(s))  CLOSTRIDIUM DIFFICILE BY PCR     Status: Abnormal   Collection Time   11/01/12  9:53 AM      Component Value Range Status Comment   C difficile by pcr POSITIVE (*) NEGATIVE Final   STOOL CULTURE     Status: Normal (Preliminary result)   Collection Time   11/01/12 12:03 PM      Component Value Range Status Comment   Specimen Description STOOL   Final    Special  Requests Normal   Final    Culture NO SUSPICIOUS COLONIES, CONTINUING TO HOLD   Final    Report Status PENDING   Incomplete   CULTURE, BLOOD (ROUTINE X 2)     Status: Normal (Preliminary result)   Collection Time   11/01/12 12:23 PM      Component Value Range Status Comment   Specimen Description BLOOD RIGHT ARM  9 ML IN [facility] BOTTLE   Final    Special Requests NONE   Final    Culture  Setup Time 11/02/2012 15:29   Final    Culture     Final    Value:        BLOOD CULTURE RECEIVED NO GROWTH TO DATE CULTURE WILL BE HELD FOR 5 DAYS BEFORE ISSUING A FINAL NEGATIVE REPORT   Report Status PENDING   Incomplete   CULTURE, BLOOD (ROUTINE X 2)     Status: Normal (Preliminary result)   Collection Time   11/01/12 12:28 PM      Component Value Range Status Comment   Specimen Description BLOOD RIGHT ARM  5 ML IN [facility] BOTTLE   Final    Special Requests NONE   Final    Culture  Setup Time 11/02/2012 15:29   Final    Culture     Final    Value:        BLOOD CULTURE RECEIVED NO GROWTH TO DATE CULTURE WILL BE HELD FOR 5 DAYS BEFORE ISSUING A FINAL NEGATIVE REPORT   Report Status PENDING   Incomplete   URINE CULTURE     Status: Normal   Collection Time   11/01/12  1:47 PM      Component Value Range Status Comment   Specimen Description URINE, CLEAN CATCH   Final    Special Requests NONE   Final    Culture  Setup Time 11/01/2012 20:08   Final    Colony Count 3,000 COLONIES/ML   Final    Culture INSIGNIFICANT GROWTH   Final    Report Status 11/02/2012 FINAL   Final      Labs: Basic Metabolic Panel:  Lab 11/03/12 1610 11/02/12 0510 11/01/12 1510 11/01/12 0839  NA 130* 131* -- 125*  K 3.3* 3.9 -- 3.7  CL 101 100 -- 89*  CO2 22 24 -- 22  GLUCOSE 98 88 -- 129*  BUN 3* 7 -- 18  CREATININE 0.65 0.66 0.73 0.85  CALCIUM 7.7* 7.7* -- 9.0  MG -- -- 2.1 --  PHOS -- -- 2.2* --   Liver Function Tests:  Lab 11/02/12 0510 11/01/12 0839  AST 16 22  ALT 7 10  ALKPHOS 44 65  BILITOT 0.2* 0.3  PROT 5.2*  7.2  ALBUMIN 2.2* 3.2*   No results found for this basename: LIPASE:5,AMYLASE:5 in the last 168 hours No results found for this basename: AMMONIA:5 in the last 168 hours CBC:  Lab 11/03/12 0511 11/02/12 0510 11/01/12 1510 11/01/12 0839  WBC 9.4 13.3* 19.2* 24.6*  NEUTROABS -- 11.1* -- 21.5*  HGB 11.6* 11.9* 12.8* 14.7  HCT 32.3* 34.8* 36.4* 41.7  MCV 93.6 95.1 94.3 93.3  PLT 204 203 221 255   Cardiac Enzymes: No results found for this basename: CKTOTAL:5,CKMB:5,CKMBINDEX:5,TROPONINI:5 in the last 168 hours BNP: BNP (last 3 results) No results found for this basename: PROBNP:3 in the last 8760 hours CBG: No results found for this basename: GLUCAP:5 in the last 168 hours     Signed:  [facility]  [facility] 11/03/2012, 3:23 PM

## 2012-11-03 NOTE — Progress Notes (Signed)
Voices understanding of d/c instructions, no changes noted since am assessment.

## 2012-11-04 NOTE — Progress Notes (Signed)
Pt d/c'd prior to CSW providing him with outpt SA information.  CSW mailed Pt information on [place], IOP services at [facility] and AA information in [place] on this date.  Providence Crosby, LCSWA Clinical Social Work [phone]

## 2012-11-05 LAB — STOOL CULTURE

## 2012-11-08 LAB — CULTURE, BLOOD (ROUTINE X 2): Culture: NO GROWTH

## 2012-12-01 ENCOUNTER — Encounter (HOSPITAL_COMMUNITY): Payer: Self-pay | Admitting: *Deleted

## 2012-12-01 ENCOUNTER — Observation Stay (HOSPITAL_COMMUNITY)
Admission: EM | Admit: 2012-12-01 | Discharge: 2012-12-01 | Disposition: A | Discharge status: Home / Self Care | Payer: MEDICAID | Attending: Internal Medicine | Admitting: Internal Medicine

## 2012-12-01 ENCOUNTER — Emergency Department (HOSPITAL_COMMUNITY): Payer: Self-pay

## 2012-12-01 DIAGNOSIS — Z8719 Personal history of other diseases of the digestive system: Secondary | ICD-10-CM | POA: Insufficient documentation

## 2012-12-01 DIAGNOSIS — Z598 Other problems related to housing and economic circumstances: Secondary | ICD-10-CM | POA: Insufficient documentation

## 2012-12-01 DIAGNOSIS — Z5987 Material hardship due to limited financial resources, not elsewhere classified: Secondary | ICD-10-CM | POA: Insufficient documentation

## 2012-12-01 DIAGNOSIS — R109 Unspecified abdominal pain: Secondary | ICD-10-CM | POA: Insufficient documentation

## 2012-12-01 DIAGNOSIS — A0472 Enterocolitis due to Clostridium difficile, not specified as recurrent: Principal | ICD-10-CM | POA: Insufficient documentation

## 2012-12-01 DIAGNOSIS — R197 Diarrhea, unspecified: Secondary | ICD-10-CM | POA: Insufficient documentation

## 2012-12-01 HISTORY — DX: Enterocolitis due to Clostridium difficile, not specified as recurrent: A04.72

## 2012-12-01 LAB — CBC WITH DIFFERENTIAL/PLATELET
Basophils Absolute: 0.2 10*3/uL — ABNORMAL HIGH (ref 0.0–0.1)
Lymphs Abs: 1.8 10*3/uL (ref 0.7–4.0)
MCH: 32.9 pg (ref 26.0–34.0)
MCV: 93.3 fL (ref 78.0–100.0)
Monocytes Absolute: 1.7 10*3/uL — ABNORMAL HIGH (ref 0.1–1.0)
Platelets: 344 10*3/uL (ref 150–400)
RDW: 13.1 % (ref 11.5–15.5)

## 2012-12-01 LAB — URINALYSIS, ROUTINE W REFLEX MICROSCOPIC
Bilirubin Urine: NEGATIVE
Hgb urine dipstick: NEGATIVE
Protein, ur: NEGATIVE mg/dL
Urobilinogen, UA: 0.2 mg/dL (ref 0.0–1.0)

## 2012-12-01 LAB — COMPREHENSIVE METABOLIC PANEL
AST: 12 U/L (ref 0–37)
Albumin: 2.5 g/dL — ABNORMAL LOW (ref 3.5–5.2)
Alkaline Phosphatase: 68 U/L (ref 39–117)
BUN: 5 mg/dL — ABNORMAL LOW (ref 6–23)
Chloride: 102 mEq/L (ref 96–112)
Potassium: 3.9 mEq/L (ref 3.5–5.1)
Total Bilirubin: 0.2 mg/dL — ABNORMAL LOW (ref 0.3–1.2)

## 2012-12-01 MED ORDER — SODIUM CHLORIDE 0.9 % IV SOLN: INTRAVENOUS | Status: DC

## 2012-12-01 MED ORDER — METRONIDAZOLE 500 MG PO TABS: 500.0000 mg | ORAL_TABLET | Freq: Three times a day (TID) | ORAL | Status: DC

## 2012-12-01 NOTE — Consult Note (Signed)
[facility] Consult Note History and Physical  Kevin Shea WUJ:[id number] DOB: [birth year] DOA: 12/01/2012  Referring physician: Dr. Loren Racer PCP: No primary provider on file.   The patient does not have a primary care physician or  Chief Complaint: Abdominal pain, diarrhea   History of Present Illness: Kevin Shea is an 62 y.o. male with a past medical history of alcohol dependency and recent hospitalization 11/01/2012-11/03/2012 for treatment of Clostridium difficile colitis. He was discharged on a course of Flagyl, which he completed.  The diarrhea returned about 3 days after completing the Flagyl.  He has been having about 10 stools a day for the past 2 weeks, and is worsening.  No associated melena or hematochezia, nausea or vomiting. Patient reports that he completely discontinued alcohol use during the course of his Flagyl therapy, but since completing the Flagyl, his been drinking about 6 beers a day.  Patient denies any other symptoms other than some weight loss associated with his diarrhea. There are no aggravating or alleviating factors. He indicates that he feels well enough to go home and is requesting that he be treated as an outpatient.  Review of Systems: Constitutional: No fever, no chills;  Appetite normal; + weight loss, no weight gain.  HEENT: No blurry vision, no diplopia, no pharyngitis, no dysphagia CV: No chest pain, no palpitations.  Resp: No SOB, no cough. GI: No nausea, no vomiting, + diarrhea, no melena, no hematochezia.  GU: No dysuria, no hematuria.  MSK: no myalgias, no arthralgias.  Neuro:  No headache, no focal neurological deficits, no history of seizures.  Psych: No depression, no anxiety.  Endo: No thyroid disease, no DM, no heat intolerance, no cold intolerance, no polyuria, no polydipsia  Skin: No rashes, no skin lesions.  Heme: No easy bruising, no history of blood diseases.  Past Medical History Past Medical History  Diagnosis Date  . Tobacco abuse  11/01/2012  . EtOH dependence 11/01/2012  . C. difficile colitis      Past Surgical History Past Surgical History  Procedure Laterality Date  . Abdominal surgery    . Hernia repair    . Appendectomy       Social History: History   Social History  . Marital Status: Married    Spouse Name: N/A    Number of Children: 1  . Years of Education: N/A   Occupational History  . Unemployed.    Social History Main Topics  . Smoking status: Current Every Day Smoker -- 1.50 packs/day for 40 years    Types: Cigarettes  . Smokeless tobacco: Current User    Types: Chew  . Alcohol Use: 50.4 oz/week    84 Cans of beer per week     Comment: 6 pk 12 oz cans daily  . Drug Use: 3.00 per week    Special: Marijuana     Comment: 12/01/12 - denies  . Sexually Active: Not on file   Other Topics Concern  . Not on file   Social History Narrative   Separated.  Lives alone.    Family History:  Family History  Problem Relation Age of Onset  . Heart failure Mother     Allergies: Review of patient's allergies indicates no known allergies.  Meds: Prior to Admission medications   Not on File    Physical Exam: Filed Vitals:   12/01/12 1355  BP: 104/67  Pulse: 74  Temp: 98.3 F (36.8 C)  TempSrc: Oral  Resp: 16  SpO2: 100%  Physical Exam: Blood pressure 104/67, pulse 74, temperature 98.3 F (36.8 C), temperature source Oral, resp. rate 16, SpO2 100.00%. Gen: No acute distress. Head: Normocephalic, atraumatic. Eyes: PERRL, EOMI, sclerae nonicteric. Mouth: Oropharynx clear with fair dentition. Neck: Supple, no thyromegaly, no lymphadenopathy, no jugular venous distention. Chest: Lungs clear to auscultation bilaterally. CV: Heart sounds regular. No murmurs, rubs, or gallops. Abdomen: Soft, nontender, nondistended with normal active bowel sounds. Extremities: Extremities without clubbing, edema, or cyanosis. Skin: Warm and dry. Neuro: Alert and oriented times 3; cranial  nerves II through XII grossly intact. Psych: Mood and affect normal.  Labs on Admission:  Basic Metabolic Panel:  Recent Labs Lab 12/01/12 1500  NA 136  K 3.9  CL 102  CO2 26  GLUCOSE 114*  BUN 5*  CREATININE 0.69  CALCIUM 8.3*   Liver Function Tests:  Recent Labs Lab 12/01/12 1500  AST 12  ALT 9  ALKPHOS 68  BILITOT 0.2*  PROT 5.8*  ALBUMIN 2.5*    Recent Labs Lab 12/01/12 1500  LIPASE 17   CBC:  Recent Labs Lab 12/01/12 1500  WBC 15.1*  NEUTROABS 11.1*  HGB 13.7  HCT 38.9*  MCV 93.3  PLT 344    Radiological Exams on Admission: Dg Abd Acute W/chest  12/01/2012  *RADIOLOGY REPORT*  Clinical Data: 62 year old male abdominal pain and diarrhea.  ACUTE ABDOMEN SERIES (ABDOMEN 2 VIEW & CHEST 1 VIEW)  Comparison: Chest radiographs 11/01/2012.  Findings: Stable lung volumes and diffuse interstitial prominence. Cardiac size and mediastinal contours are within normal limits. Visualized tracheal air column is within normal limits.  No pneumothorax, pneumoperitoneum, or acute pulmonary opacity.  Nonobstructed bowel gas pattern.  Suggestion of wall thickening of the ascending and descending colon on the supine view of the abdomen.  Other abdominal and pelvic visceral contours are within normal limits. No acute osseous abnormality identified. Mild thoracolumbar scoliosis.  IMPRESSION: 1. Nonobstructed bowel gas pattern, no free air.  Suggestion of colonic wall thickening such as due to colitis. 2. No acute cardiopulmonary abnormality.   Original Report Authenticated By: Erskine Speed, M.D.     Assessment/Plan Principal Problem:   C. difficile colitis -Patient will be discharged on a two-week course of therapy at full dose Flagyl with a slow taper over the next month. He does not need to be treated inpatient as he has a normal BUN to creatinine ratio and no electrolyte disturbances. His white blood cell count is only mildly elevated and he is nontoxic in appearance. -Patient  was instructed fully on had a decontaminate his environment to help prevent reinfection. -Patient was counseled that he would need to followup with a primary care physician for persistent symptoms. Active Problems:   EtOH dependence -Although the patient was detoxified with Ativan during his previous hospital stay, he never developed any signs of DTs or significant withdrawal symptoms. -Patient was counseled on abstinence while on Flagyl therapy.  Code Status: Full. Family Communication: Kevin Shea (daughter) [phone] Disposition Plan: Discharge home.  Time spent: 1 hour.  RAMA,CHRISTINA [facility] Pager [phone]  If 7PM-7AM, please contact night-coverage [email or web address] Password [facility] 12/01/2012, 6:09 PM

## 2012-12-01 NOTE — ED Notes (Signed)
Pt reports being treated for C diff a month ago, finished abx as prescribed. Sts diarrhea began very shortly after completing abxs. C/o abd pressure. Denies blood in stool. Denies n/v.

## 2012-12-01 NOTE — ED Provider Notes (Signed)
History     CSN: [id number]  Arrival date & time 12/01/12  1332   First MD Initiated Contact with Patient 12/01/12 1503      Chief Complaint  Patient presents with  . Abdominal Pain  . Diarrhea    (Consider location/radiation/quality/duration/timing/severity/associated sxs/prior treatment) HPI Pt recently hospitalized and treated for Cdiff colitis. Finished abx 2 weeks ago and has had progressive diarrhea and abd pain since. Pt states he is having 4-5 BM's daily. No fever or chills. No blood in stool. No nausea or vomiting.  Past Medical History  Diagnosis Date  . Tobacco abuse 11/01/2012  . EtOH dependence 11/01/2012  . C. difficile colitis     Past Surgical History  Procedure Laterality Date  . Abdominal surgery    . Hernia repair    . Appendectomy      History reviewed. No pertinent family history.  History  Substance Use Topics  . Smoking status: Current Every Day Smoker -- 1.50 packs/day for 40 years    Types: Cigarettes  . Smokeless tobacco: Current User    Types: Chew  . Alcohol Use: 50.4 oz/week    84 Cans of beer per week     Comment: 6 pk 12 oz cans daily      Review of Systems  Constitutional: Negative for fever and chills.  Cardiovascular: Negative for chest pain.  Gastrointestinal: Positive for abdominal pain and diarrhea. Negative for nausea, vomiting, constipation and abdominal distention.  Musculoskeletal: Negative for back pain.  Skin: Negative for rash.  Neurological: Negative for weakness, light-headedness, numbness and headaches.  All other systems reviewed and are negative.    Allergies  Review of patient's allergies indicates no known allergies.  Home Medications  No current outpatient prescriptions on file.  BP 104/67  Pulse 74  Temp(Src) 98.3 F (36.8 C) (Oral)  Resp 16  SpO2 100%  Physical Exam  Nursing note and vitals reviewed. Constitutional: He is oriented to person, place, and time. He appears well-developed and  well-nourished. No distress.  HENT:  Head: Normocephalic and atraumatic.  Mouth/Throat: Oropharynx is clear and moist.  Eyes: EOM are normal. Pupils are equal, round, and reactive to light.  Neck: Normal range of motion. Neck supple.  Cardiovascular: Normal rate and regular rhythm.   Pulmonary/Chest: Effort normal and breath sounds normal. No respiratory distress. He has no wheezes. He has no rales. He exhibits no tenderness.  Abdominal: Soft. Bowel sounds are normal. He exhibits no distension and no mass. There is tenderness (very mild generalized TTP. No rebound or guarding). There is no rebound and no guarding.  Musculoskeletal: Normal range of motion. He exhibits no edema and no tenderness.  Neurological: He is alert and oriented to person, place, and time.  Moves all ext without deficit.   Skin: Skin is warm and dry. No rash noted. No erythema.  Psychiatric: He has a normal mood and affect. His behavior is normal.    ED Course  Procedures (including critical care time)  Labs Reviewed  COMPREHENSIVE METABOLIC PANEL - Abnormal; Notable for the following:    Glucose, Bld 114 (*)    BUN 5 (*)    Calcium 8.3 (*)    Total Protein 5.8 (*)    Albumin 2.5 (*)    Total Bilirubin 0.2 (*)    All other components within normal limits  CBC WITH DIFFERENTIAL - Abnormal; Notable for the following:    WBC 15.1 (*)    RBC 4.17 (*)    HCT  38.9 (*)    Neutro Abs 11.1 (*)    Monocytes Absolute 1.7 (*)    Basophils Absolute 0.2 (*)    All other components within normal limits  CLOSTRIDIUM DIFFICILE BY PCR  LIPASE, BLOOD  URINALYSIS, ROUTINE W REFLEX MICROSCOPIC   Dg Abd Acute W/chest  12/01/2012  *RADIOLOGY REPORT*  Clinical Data: 62 year old male abdominal pain and diarrhea.  ACUTE ABDOMEN SERIES (ABDOMEN 2 VIEW & CHEST 1 VIEW)  Comparison: Chest radiographs 11/01/2012.  Findings: Stable lung volumes and diffuse interstitial prominence. Cardiac size and mediastinal contours are within normal  limits. Visualized tracheal air column is within normal limits.  No pneumothorax, pneumoperitoneum, or acute pulmonary opacity.  Nonobstructed bowel gas pattern.  Suggestion of wall thickening of the ascending and descending colon on the supine view of the abdomen.  Other abdominal and pelvic visceral contours are within normal limits. No acute osseous abnormality identified. Mild thoracolumbar scoliosis.  IMPRESSION: 1. Nonobstructed bowel gas pattern, no free air.  Suggestion of colonic wall thickening such as due to colitis. 2. No acute cardiopulmonary abnormality.   Original Report Authenticated By: Erskine Speed, M.D.      1. Colitis, Clostridium difficile       MDM  Discussed with Dr Rama who will admit pt.         Loren Racer, MD 12/01/12 [phone]

## 2012-12-01 NOTE — Progress Notes (Signed)
ED CM noted pt without a pcp listed Reviewed EPIC notes for pt last admission Pt was offered list of self pay pcp Pt confirms he did not f/u with a self pay pcp Male at bedside stated [facility] urgent care called and informed pt of $50 co pay and "he don't have it"   ED Cm reviewed with pt and male discounted self pay [place] pcps and that generally all self pay providers will ask for funds on first visit.  CM discussed and provided written information for self pay pcps, importance of pcp for f/u care, www.needymeds.org, discounted pharmacies, and other [place] resources such as financial assistance, DSS and  health department Reviewed Health connect number to assist with finding self pay provider close to pt's residence. Reviewed resources for [facility], general medical clinics, [facility] out patient [facility]ies, housing, and other resources in [employer]. Pt voiced understanding and appreciation of resources provided

## 2012-12-05 ENCOUNTER — Telehealth (HOSPITAL_COMMUNITY): Payer: Self-pay | Admitting: Emergency Medicine

## 2012-12-05 NOTE — ED Notes (Signed)
Chart returned from EDP office. Per Wynetta Emery PA-C, patient admitted and treated for C. Diff.

## 2013-03-29 ENCOUNTER — Ambulatory Visit: Payer: Self-pay | Attending: Family Medicine | Admitting: Internal Medicine

## 2013-03-29 VITALS — BP 103/69 | HR 77 | Temp 98.4°F | Ht 69.5 in | Wt 120.0 lb

## 2013-03-29 DIAGNOSIS — A0472 Enterocolitis due to Clostridium difficile, not specified as recurrent: Secondary | ICD-10-CM

## 2013-03-29 MED ORDER — VANCOMYCIN HCL 125 MG PO CAPS: 125.0000 mg | ORAL_CAPSULE | Freq: Four times a day (QID) | ORAL | Status: AC

## 2013-03-29 MED ORDER — VANCOMYCIN HCL 125 MG PO CAPS: 250.0000 mg | ORAL_CAPSULE | Freq: Four times a day (QID) | ORAL | Status: DC

## 2013-03-29 NOTE — Progress Notes (Signed)
Patient ID: Kevin Shea, male   DOB: March 29, 1951, 62 y.o.   MRN: [id number]  CC:  HPI: This is a 62 year old male who has had 2 courses of Flagyl for C. difficile colitis. His last course finished 5 days ago and he noted 2 days later that diarrhea recurred. He is having 4-5 stools during the day and at least 2 stools in the middle of the night. He does not have any associated nausea vomiting abdominal pain or hematochezia.  No Known Allergies Past Medical History  Diagnosis Date  . Tobacco abuse 11/01/2012  . EtOH dependence 11/01/2012  . C. difficile colitis    Current Outpatient Prescriptions on File Prior to Visit  Medication Sig Dispense Refill  . metroNIDAZOLE (FLAGYL) 500 MG tablet Take 1 tablet (500 mg total) by mouth 3 (three) times daily.  70 tablet  1   No current facility-administered medications on file prior to visit.   Family History  Problem Relation Age of Onset  . Heart failure Mother    History   Social History  . Marital Status: Married    Spouse Name: N/A    Number of Children: 1  . Years of Education: N/A   Occupational History  . Unemployed.    Social History Main Topics  . Smoking status: Current Every Day Smoker -- 1.50 packs/day for 40 years    Types: Cigarettes  . Smokeless tobacco: Current User    Types: Chew  . Alcohol Use: 50.4 oz/week    84 Cans of beer per week     Comment: 6 pk 12 oz cans daily  . Drug Use: 3.00 per week    Special: Marijuana     Comment: 12/01/12 - denies  . Sexually Active: Not on file   Other Topics Concern  . Not on file   Social History Narrative   Separated.  Lives alone.    Review of Systems ______ Constitutional: Negative for fever, chills, diaphoresis, activity change, appetite change and fatigue. ____ HENT: Negative for ear pain, nosebleeds, congestion, facial swelling, rhinorrhea, neck pain, neck stiffness and ear discharge.  ____ Eyes: Negative for pain, discharge, redness, itching and visual disturbance.  ____ Respiratory: Negative for cough, choking, chest tightness, shortness of breath, wheezing and stridor.  ____ Cardiovascular: Negative for chest pain, palpitations and leg swelling. ____ Gastrointestinal: Negative for abdominal distention. Positive for diarrhea as mentioned in history Genitourinary: Negative for dysuria, urgency, frequency, hematuria, flank pain, decreased urine volume, difficulty urinating and dyspareunia. ____ Musculoskeletal: Negative for back pain, joint swelling, arthralgias and gait problem. ________ Neurological: Negative for dizziness, tremors, seizures, syncope, facial asymmetry, speech difficulty, weakness, light-headedness, numbness and headaches. ____ Hematological: Negative for adenopathy. Does not bruise/bleed easily. ____ Psychiatric/Behavioral: Negative for hallucinations, behavioral problems, confusion, dysphoric mood, decreased concentration and agitation. ______   Objective:   Filed Vitals:   03/29/13 0956  BP: 103/69  Pulse: 77  Temp: 98.4 F (36.9 C)    Physical Exam ______ Constitutional: Appears well-developed and well-nourished. No distress. ____ HENT: Normocephalic. External right and left ear normal. Oropharynx is clear and moist. ____ Eyes: Conjunctivae and EOM are normal. PERRLA, no scleral icterus. ____ Neck: Normal ROM. Neck supple. No JVD. No tracheal deviation. No thyromegaly. ____ CVS: RRR, S1/S2 +, no murmurs, no gallops, no carotid bruit.  Pulmonary: Effort and breath sounds normal, no stridor, rhonchi, wheezes, rales.  Abdominal: Soft. BS +,  no distension, tenderness, rebound or guarding. ________ Musculoskeletal: Normal range of motion. No edema and  no tenderness. ____ Lymphadenopathy: No lymphadenopathy noted, cervical, inguinal. Neuro: Alert. Normal reflexes, muscle tone coordination. No cranial nerve deficit. Skin: Skin is warm and dry. No rash noted. Not diaphoretic. No erythema. No pallor. ____ Psychiatric: Normal mood  and affect. Behavior, judgment, thought content normal. __  Lab Results  Component Value Date   WBC 15.1* 12/01/2012   HGB 13.7 12/01/2012   HCT 38.9* 12/01/2012   MCV 93.3 12/01/2012   PLT 344 12/01/2012   Lab Results  Component Value Date   CREATININE 0.69 12/01/2012   BUN 5* 12/01/2012   NA 136 12/01/2012   K 3.9 12/01/2012   CL 102 12/01/2012   CO2 26 12/01/2012    No results found for this basename: HGBA1C   Lipid Panel  No results found for this basename: chol, trig, hdl, cholhdl, vldl, ldlcalc       Assessment and plan:   Patient Active Problem List   Diagnosis Date Noted  . C. difficile colitis 11/02/2012  . Diarrhea 11/01/2012  . Hyponatremia 11/01/2012  . Leukocytosis 11/01/2012  . Dehydration 11/01/2012  . Hypotension 11/01/2012  . Tobacco abuse 11/01/2012  . EtOH dependence 11/01/2012        #1. C. difficile colitis: I will start him on a vancomycin taper-125 mg 4 times a day x2 weeks followed by twice a day x1 week followed by daily x1 week. He is also advised to submit stool for a repeat C. difficile PCR to ensure that this is still C. difficile colitis that we are dealing with. Advised to return to the clinic in 2 weeks.

## 2013-03-29 NOTE — Progress Notes (Signed)
CSW met with patient about availability of medication.  Patient and CSW completed referral for medication program.  CSW also recommended that patient follow up with MAP program.   CSW will follow up with patient once medication programs respond.  Beverly Sessions MSW, LCSW [phone]

## 2013-03-30 ENCOUNTER — Other Ambulatory Visit: Payer: Self-pay | Admitting: Internal Medicine

## 2013-03-31 LAB — CLOSTRIDIUM DIFFICILE BY PCR: Toxigenic C. Difficile by PCR: DETECTED — CR

## 2013-04-05 ENCOUNTER — Other Ambulatory Visit: Payer: Self-pay | Admitting: Internal Medicine

## 2013-04-08 ENCOUNTER — Encounter: Payer: Self-pay | Admitting: *Deleted

## 2013-04-08 NOTE — Progress Notes (Signed)
Patient arrived to pick up Vacomycin that was provided through Patient Assistance Program.  CSWN reiterated directions for dosage as given by the MD.  CSWN will continue to follow as needed.  There are no further needs at this time concerning this issue.  Patient will make appointment to follow up with MD.    Beverly Sessions MSW, LCSW [phone]

## 2013-05-10 ENCOUNTER — Ambulatory Visit: Payer: Self-pay | Admitting: Family Medicine

## 2013-05-13 ENCOUNTER — Ambulatory Visit: Payer: Self-pay | Attending: Family Medicine | Admitting: Internal Medicine

## 2013-05-13 NOTE — Progress Notes (Unsigned)
Patient ID: Kevin Shea, male   DOB: 05/28/1951, 62 y.o.   MRN: [id number]  CC:  HPI: 62 year old male who presents to the clinic for followup of her C. difficile. The patient has had 2 courses of Flagyl and 1 course of vancomycin. He last received vancomycin for 4 weeks. It has been 6 days and the patient has not had any recurrence of his diarrhea. He denies any nausea vomiting abdominal bloating or hematochezia.   No Known Allergies Past Medical History  Diagnosis Date  . Tobacco abuse 11/01/2012  . EtOH dependence 11/01/2012  . C. difficile colitis    Current Outpatient Prescriptions on File Prior to Visit  Medication Sig Dispense Refill  . vancomycin (VANCOCIN) 125 MG capsule Take 1 capsule (125 mg total) by mouth 4 (four) times daily.  77 capsule  0   No current facility-administered medications on file prior to visit.   Family History  Problem Relation Age of Onset  . Heart failure Mother    History   Social History  . Marital Status: Married    Spouse Name: N/A    Number of Children: 1  . Years of Education: N/A   Occupational History  . Unemployed.    Social History Main Topics  . Smoking status: Current Every Day Smoker -- 1.50 packs/day for 40 years    Types: Cigarettes  . Smokeless tobacco: Current User    Types: Chew  . Alcohol Use: 50.4 oz/week    84 Cans of beer per week     Comment: 6 pk 12 oz cans daily  . Drug Use: 3.00 per week    Special: Marijuana     Comment: 12/01/12 - denies  . Sexual Activity: Not on file   Other Topics Concern  . Not on file   Social History Narrative   Separated.  Lives alone.    Review of Systems  Constitutional: Negative for fever, chills, diaphoresis, activity change, appetite change and fatigue.  HENT: Negative for ear pain, nosebleeds, congestion, facial swelling, rhinorrhea, neck pain, neck stiffness and ear discharge.   Eyes: Negative for pain, discharge, redness, itching and visual disturbance.  Respiratory:  Negative for cough, choking, chest tightness, shortness of breath, wheezing and stridor.   Cardiovascular: Negative for chest pain, palpitations and leg swelling.  Gastrointestinal: Negative for abdominal distention.  Genitourinary: Negative for dysuria, urgency, frequency, hematuria, flank pain, decreased urine volume, difficulty urinating and dyspareunia.  Musculoskeletal: Negative for back pain, joint swelling, arthralgias and gait problem.  Neurological: Negative for dizziness, tremors, seizures, syncope, facial asymmetry, speech difficulty, weakness, light-headedness, numbness and headaches.  Hematological: Negative for adenopathy. Does not bruise/bleed easily.  Psychiatric/Behavioral: Negative for hallucinations, behavioral problems, confusion, dysphoric mood, decreased concentration and agitation.    Objective:   Filed Vitals:   05/13/13 0927  BP: 129/78  Pulse: 69  Temp: 98.1 F (36.7 C)  Resp: 18    Physical Exam  Constitutional: Appears well-developed and well-nourished. No distress.  HENT: Normocephalic. External right and left ear normal. Oropharynx is clear and moist.  Eyes: Conjunctivae and EOM are normal. PERRLA, no scleral icterus.  Neck: Normal ROM. Neck supple. No JVD. No tracheal deviation. No thyromegaly.  CVS: RRR, S1/S2 +, no murmurs, no gallops, no carotid bruit.  Pulmonary: Effort and breath sounds normal, no stridor, rhonchi, wheezes, rales.  Abdominal: Soft. BS +,  no distension, tenderness, rebound or guarding.  Musculoskeletal: Normal range of motion. No edema and no tenderness.  Lymphadenopathy: No lymphadenopathy noted,  cervical, inguinal. Neuro: Alert. Normal reflexes, muscle tone coordination. No cranial nerve deficit. Skin: Skin is warm and dry. No rash noted. Not diaphoretic. No erythema. No pallor.  Psychiatric: Normal mood and affect. Behavior, judgment, thought content normal.   Lab Results  Component Value Date   WBC 15.1* 12/01/2012   HGB  13.7 12/01/2012   HCT 38.9* 12/01/2012   MCV 93.3 12/01/2012   PLT 344 12/01/2012   Lab Results  Component Value Date   CREATININE 0.69 12/01/2012   BUN 5* 12/01/2012   NA 136 12/01/2012   K 3.9 12/01/2012   CL 102 12/01/2012   CO2 26 12/01/2012    No results found for this basename: HGBA1C   Lipid Panel  No results found for this basename: chol, trig, hdl, cholhdl, vldl, ldlcalc       Assessment and plan:   Patient Active Problem List   Diagnosis Date Noted  . C. difficile colitis 11/02/2012  . Diarrhea 11/01/2012  . Hyponatremia 11/01/2012  . Leukocytosis 11/01/2012  . Dehydration 11/01/2012  . Hypotension 11/01/2012  . Tobacco abuse 11/01/2012  . EtOH dependence 11/01/2012       C. difficile colitis Now resolved No symptoms for 6 days Patient advised to followup on a routine basis Return sooner if the patient develops abdominal diarrhea cramping nausea vomiting, in which case if he does have a recurrence of C. difficile he will need infectious disease consultation

## 2013-05-13 NOTE — Progress Notes (Unsigned)
Patient here for follow up- c-diff

## 2013-05-18 ENCOUNTER — Ambulatory Visit: Payer: Self-pay

## 2013-05-20 ENCOUNTER — Ambulatory Visit: Payer: Self-pay | Attending: Family Medicine

## 2013-09-10 IMAGING — CR DG CHEST 2V
2 series · 2 of 2 positions shown · non-contrast
Comparison: None.

CLINICAL DATA: Leukocytosis.

CHEST - 2 VIEW

[w chest pa]
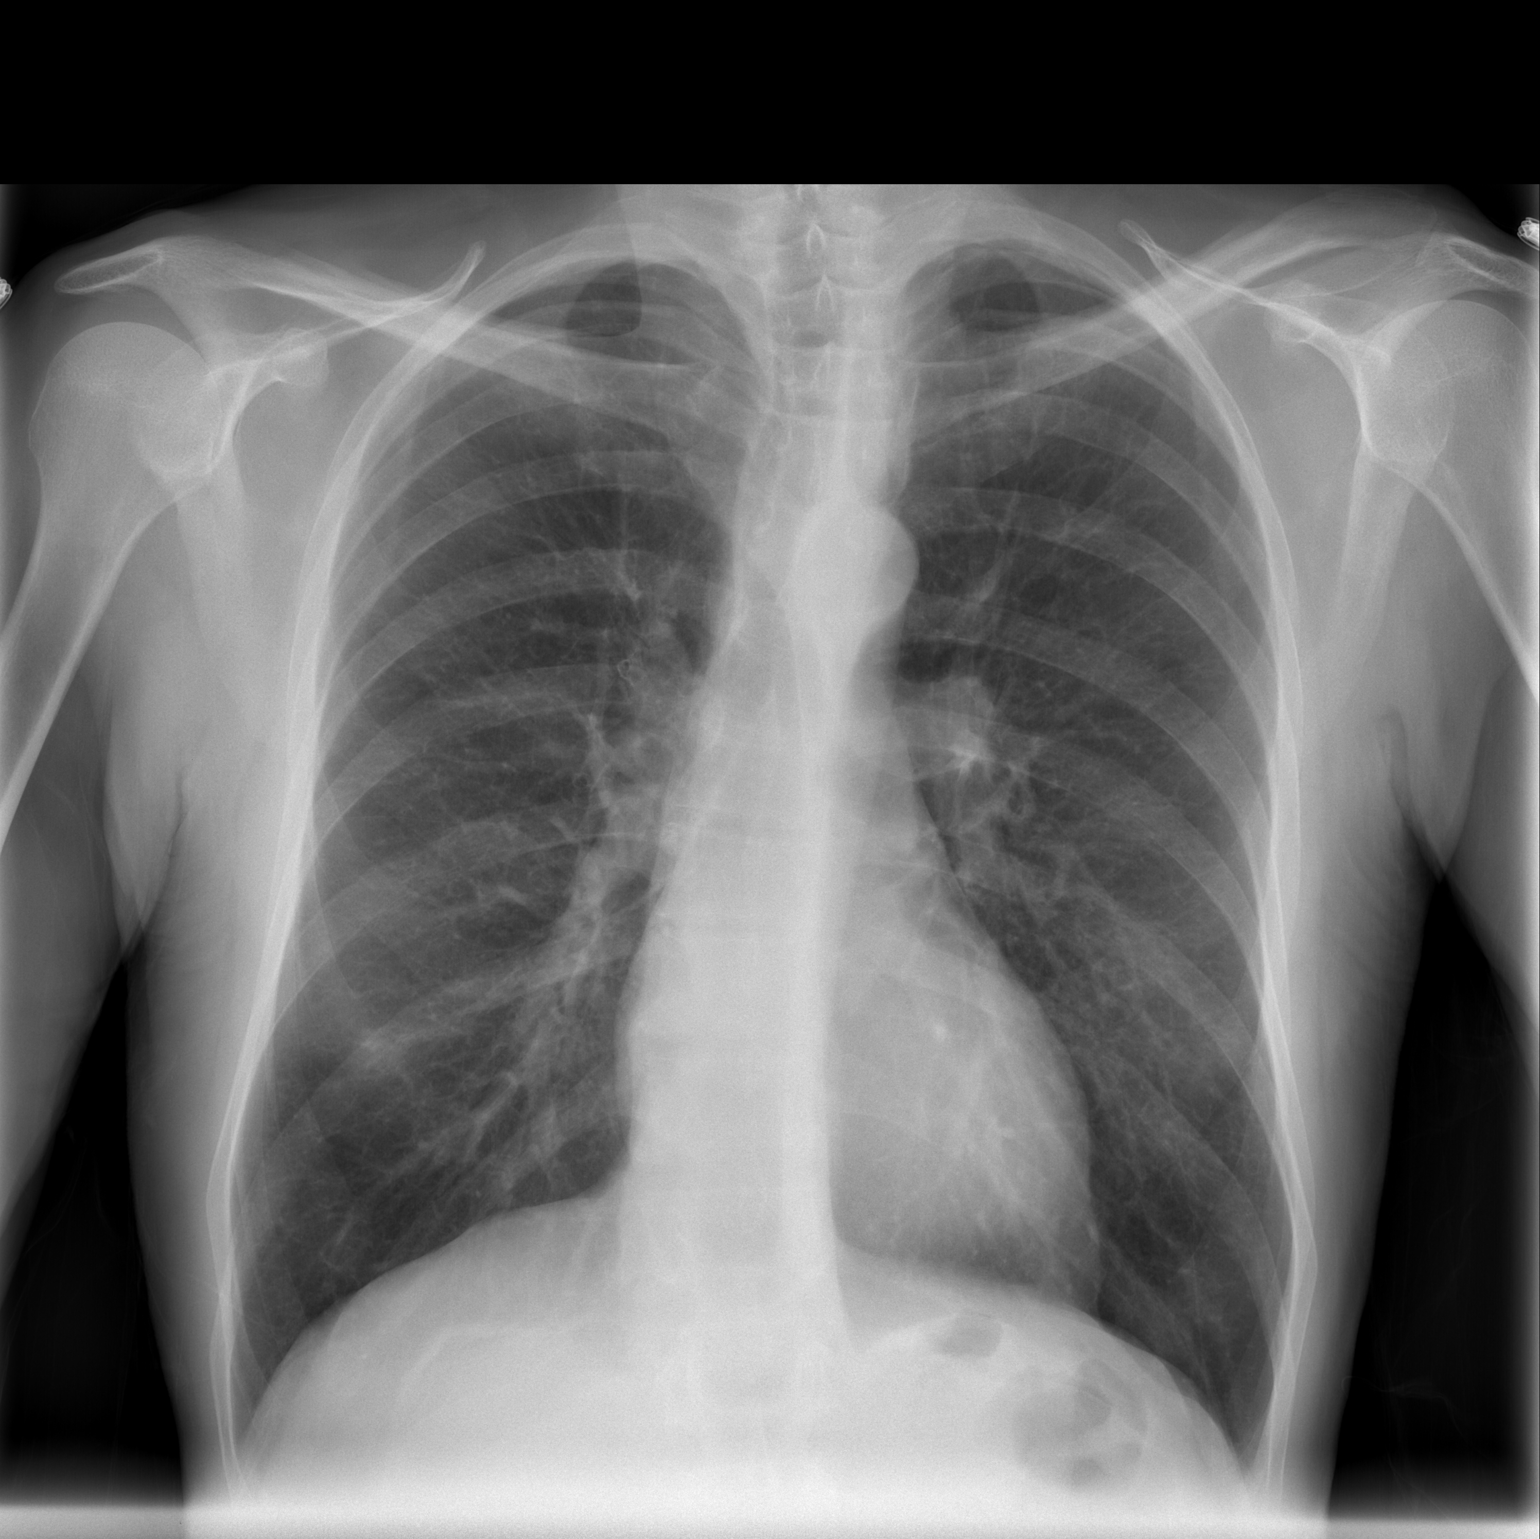

[w chest lat]
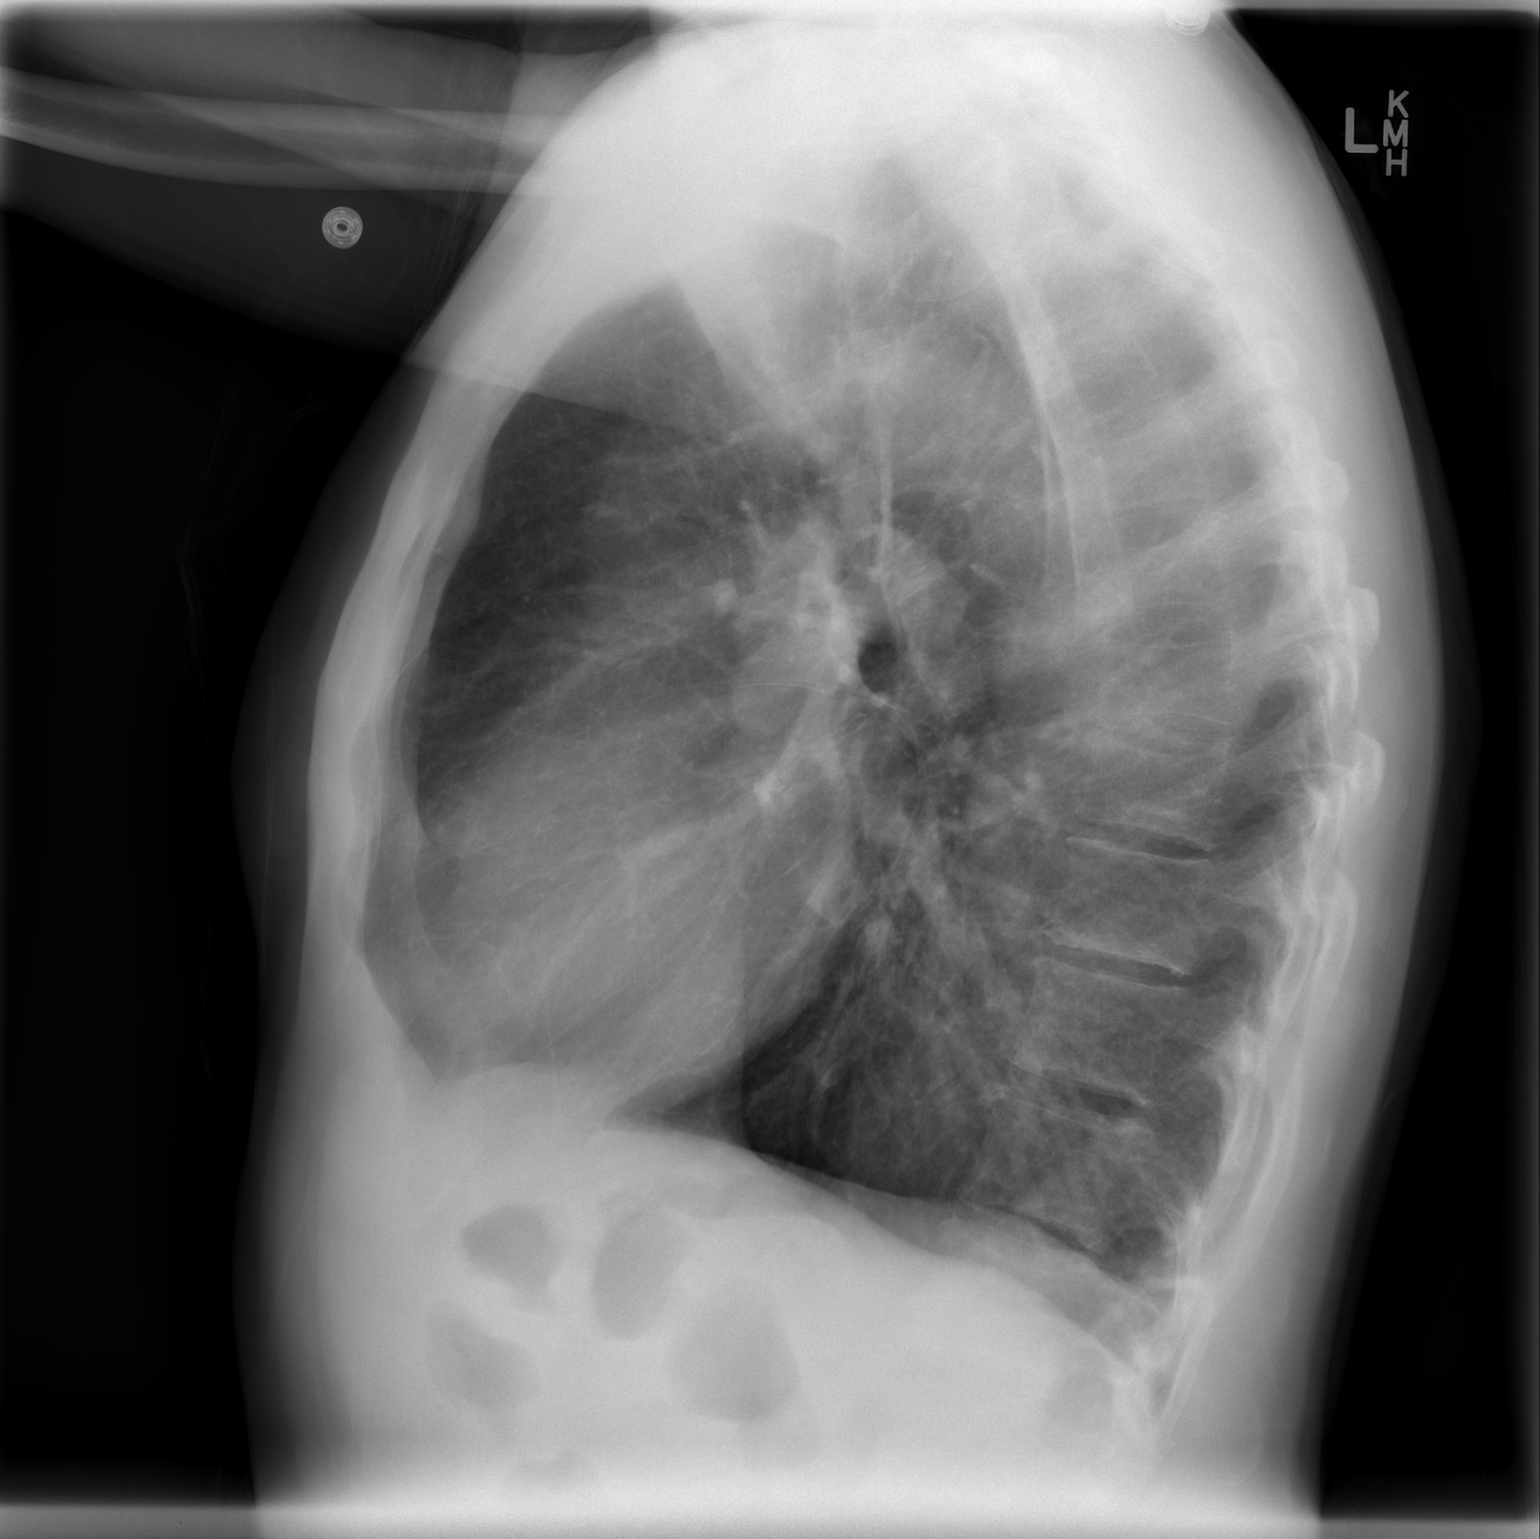

[2 of 2 positions shown; findings below may reference images not displayed]

FINDINGS: The heart size is normal.  Mild emphysematous changes are
present.  No focal airspace disease is evident.  The visualized
soft tissues and bony thorax are unremarkable.
IMPRESSION: 1.  No acute cardiopulmonary disease.
2.  Emphysema.

## 2017-09-25 ENCOUNTER — Emergency Department (HOSPITAL_COMMUNITY)
Admission: EM | Admit: 2017-09-25 | Discharge: 2017-09-25 | Disposition: A | Discharge status: Home / Self Care | Payer: Self-pay | Attending: Emergency Medicine | Admitting: Emergency Medicine

## 2017-09-25 ENCOUNTER — Encounter (HOSPITAL_COMMUNITY): Payer: Self-pay | Admitting: Emergency Medicine

## 2017-09-25 ENCOUNTER — Other Ambulatory Visit: Payer: Self-pay

## 2017-09-25 DIAGNOSIS — X04XXXA Exposure to ignition of highly flammable material, initial encounter: Secondary | ICD-10-CM | POA: Insufficient documentation

## 2017-09-25 DIAGNOSIS — Z79899 Other long term (current) drug therapy: Secondary | ICD-10-CM | POA: Insufficient documentation

## 2017-09-25 DIAGNOSIS — Y939 Activity, unspecified: Secondary | ICD-10-CM | POA: Insufficient documentation

## 2017-09-25 DIAGNOSIS — T300 Burn of unspecified body region, unspecified degree: Secondary | ICD-10-CM

## 2017-09-25 DIAGNOSIS — F17210 Nicotine dependence, cigarettes, uncomplicated: Secondary | ICD-10-CM | POA: Insufficient documentation

## 2017-09-25 DIAGNOSIS — Z23 Encounter for immunization: Secondary | ICD-10-CM | POA: Insufficient documentation

## 2017-09-25 DIAGNOSIS — Y929 Unspecified place or not applicable: Secondary | ICD-10-CM | POA: Insufficient documentation

## 2017-09-25 DIAGNOSIS — T2100XA Burn of unspecified degree of trunk, unspecified site, initial encounter: Secondary | ICD-10-CM | POA: Insufficient documentation

## 2017-09-25 DIAGNOSIS — Y999 Unspecified external cause status: Secondary | ICD-10-CM | POA: Insufficient documentation

## 2017-09-25 MED ORDER — SILVER SULFADIAZINE 1 % EX CREA
TOPICAL_CREAM | Freq: Once | CUTANEOUS | Status: AC
  Administered 2017-09-25: 09:00:00 via TOPICAL
  Filled 2017-09-25: qty 85

## 2017-09-25 MED ORDER — SILVER SULFADIAZINE 1 % EX CREA: 1.0000 "application " | TOPICAL_CREAM | Freq: Every day | CUTANEOUS | 0 refills | Status: AC

## 2017-09-25 MED ORDER — TETANUS-DIPHTH-ACELL PERTUSSIS 5-2.5-18.5 LF-MCG/0.5 IM SUSP
0.5000 mL | Freq: Once | INTRAMUSCULAR | Status: AC
  Administered 2017-09-25: 0.5 mL via INTRAMUSCULAR
  Filled 2017-09-25: qty 0.5

## 2017-09-25 MED ORDER — HYDROCODONE-ACETAMINOPHEN 5-325 MG PO TABS: 1.0000 | ORAL_TABLET | ORAL | 0 refills | Status: AC | PRN
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
## 2017-09-25 NOTE — Discharge Instructions (Signed)
Your burns are healing well considering it has been less than 1 week.  To improve healing, wash once or twice a day after soaking in a tub or shower.  Clean the skin well with an antibacterial soap.  After using soap rinse and dry well.  Apply a thin film of antibiotic cream to the wounds, then apply a light gauze bandage, once or twice daily until they are healed.  You can also use an over-the-counter antibiotic ointment such as Neosporin, if you run out of the Silvadene.  For pain take ibuprofen 400 mg 3 times a day.  Use the narcotic pain reliever if needed but do not drive or operate machinery after taking it.  Most of your wounds will heal in the next 2-3 weeks.  Some areas of deeper wounds, are partially full-thickness and may take several months to improve.  Follow-up with a primary care doctor as needed for problems.

## 2023-03-24 ENCOUNTER — Encounter: Payer: Self-pay | Admitting: Family Medicine

## 2023-03-24 ENCOUNTER — Ambulatory Visit (INDEPENDENT_AMBULATORY_CARE_PROVIDER_SITE_OTHER): Payer: Medicare PPO | Admitting: Family Medicine

## 2023-03-24 VITALS — BP 112/72 | HR 77 | Temp 97.7°F | Ht 67.5 in | Wt 121.0 lb

## 2023-03-24 DIAGNOSIS — Z720 Tobacco use: Secondary | ICD-10-CM

## 2023-03-24 DIAGNOSIS — Z114 Encounter for screening for human immunodeficiency virus [HIV]: Secondary | ICD-10-CM

## 2023-03-24 DIAGNOSIS — Z7689 Persons encountering health services in other specified circumstances: Secondary | ICD-10-CM

## 2023-03-24 DIAGNOSIS — L409 Psoriasis, unspecified: Secondary | ICD-10-CM

## 2023-03-24 DIAGNOSIS — H9193 Unspecified hearing loss, bilateral: Secondary | ICD-10-CM

## 2023-03-24 DIAGNOSIS — F1010 Alcohol abuse, uncomplicated: Secondary | ICD-10-CM | POA: Insufficient documentation

## 2023-03-24 DIAGNOSIS — Z9189 Other specified personal risk factors, not elsewhere classified: Secondary | ICD-10-CM

## 2023-03-24 DIAGNOSIS — Z1159 Encounter for screening for other viral diseases: Secondary | ICD-10-CM

## 2023-03-24 DIAGNOSIS — F1029 Alcohol dependence with unspecified alcohol-induced disorder: Secondary | ICD-10-CM

## 2023-03-24 DIAGNOSIS — Z13220 Encounter for screening for lipoid disorders: Secondary | ICD-10-CM

## 2023-03-24 MED ORDER — CLOBETASOL PROPIONATE 0.05 % EX OINT: 1.0000 | TOPICAL_OINTMENT | Freq: Two times a day (BID) | CUTANEOUS | 2 refills | Status: DC

## 2023-03-24 NOTE — Progress Notes (Unsigned)
New Patient Office Visit  Subjective    Patient ID: Kevin Shea, male    DOB: 10/18/1950  Age: 72 y.o. MRN: [id number]  CC:  Chief Complaint  Patient presents with   New Patient (Initial Visit)    Establish care    HPI Kevin Shea presents to establish care  Patient has never had a primary care provider before.  Coming in now because he is wanting to do something about the rash on his arms and back.  The rash has been there for several years.  Daughter is with him for the second half of the encounter and states that it has been there since she was a child.  Has tried some things like baby oil to keep it "moist" but no other treatments.  Rash is pruritic.  Sometimes bleeds if he scratches it.  Patient has difficulty hearing.  Will be willing to get hearing aids if he could afford it.  Would be willing to see an audiologist to have his hearing evaluated.  Patient, per the daughter, has had 3 episodes of "left-sided weakness".  Last time was last week.  These episodes are temporary but in general his left leg is weaker than right.  Patient does not member the last time he went more than 1 day without alcohol use.  He drinks 6-12 beers a day.  Does not remember having any withdrawal symptoms ever.  Has not been told that he needs to quit.  Does not feel it is interfering with his day-to-day.   PMH: alcohol and tobacco abuse  PSH: Appendectomy, hernia surgery.    FH: maternal uncle/aunts - unknown cancer.    Tobacco use: 1 ppd.  Since he was a teenager.   Alcohol use: daily beer use.  6-12 beers/ days.   Drug use: marijuana.   Marital status: single.   Employment: not currently working.   Sexual hx: not recently.     Outpatient Encounter Medications as of 03/24/2023  Medication Sig   clobetasol ointment (TEMOVATE) 0.05 % Apply 1 Application topically 2 (two) times daily.   HYDROcodone-acetaminophen (NORCO) 5-325 MG tablet Take 1 tablet by mouth every 4 (four) hours as  needed. (Patient not taking: Reported on 03/24/2023)   silver sulfADIAZINE (SILVADENE) 1 % cream Apply 1 application topically daily. (Patient not taking: Reported on 03/24/2023)   vancomycin (VANCOCIN) 125 MG capsule Take 1 capsule (125 mg total) by mouth 4 (four) times daily. (Patient not taking: Reported on 03/24/2023)   No facility-administered encounter medications on file as of 03/24/2023.    Past Medical History:  Diagnosis Date   C. difficile colitis    C. difficile colitis 11/02/2012   EtOH dependence (HCC) 11/01/2012   Tobacco abuse 11/01/2012    Past Surgical History:  Procedure Laterality Date   ABDOMINAL SURGERY     APPENDECTOMY     HERNIA REPAIR      Family History  Problem Relation Age of Onset   Heart failure Mother     Social History   Socioeconomic History   Marital status: Married    Spouse name: Not on file   Number of children: 1   Years of education: Not on file   Highest education level: Not on file  Occupational History   Occupation: Unemployed.  Tobacco Use   Smoking status: Every Day    Packs/day: 1.50    Years: 40.00    Additional pack years: 0.00    Total pack years: 60.00  Types: Cigarettes   Smokeless tobacco: Current    Types: Chew  Substance and Sexual Activity   Alcohol use: Yes    Alcohol/week: 84.0 standard drinks of alcohol    Types: 84 Cans of beer per week    Comment: 6 pk 12 oz cans daily   Drug use: Yes    Frequency: 3.0 times per week    Types: Marijuana    Comment: 12/01/12 - denies   Sexual activity: Not on file  Other Topics Concern   Not on file  Social History Narrative   Separated.  Lives alone.   Social Determinants of Health   Financial Resource Strain: Not on file  Food Insecurity: Not on file  Transportation Needs: Not on file  Physical Activity: Not on file  Stress: Not on file  Social Connections: Not on file  Intimate Partner Violence: Not on file    ROS      Objective    BP 112/72    Pulse 77   Temp 97.7 F (36.5 C) (Oral)   Ht 5' 7.5" (1.715 m)   Wt 121 lb (54.9 kg)   SpO2 98%   BMI 18.67 kg/m   Physical Exam General: Alert, oriented.  Appears older than stated age. HEENT: Poor dentition.  PERRLA, EOMI CV: Regular rhythm Pulmonary: Lungs are bilaterally GI: Soft and nontender MSK: Slight decrease strength on the left lower extremity compared to right.  Overall 5/5 Skin: Multiple raised scaly papular lesions on the knees, arms and lower back.      {Labs (Optional):23779}    Assessment & Plan:   Alcohol abuse -     Comprehensive metabolic panel; Future -     Hemoglobin A1c; Future  Tobacco use Assessment & Plan: Discuss low dose annual ct at next visit.   Continue to encourage cessation  Orders: -     Comprehensive metabolic panel; Future -     CBC With Differential; Future  Screening cholesterol level -     Lipid panel; Future  Screening for HIV (human immunodeficiency virus) -     HIV Antibody (routine testing w rflx); Future  Encounter for hepatitis C virus screening test for high risk patient -     Hepatitis C antibody; Future  Bilateral hearing loss, unspecified hearing loss type Assessment & Plan: Ear lavage with nurse visit Audiology referral  Orders: -     Ambulatory referral to Audiology  Encounter to establish care Assessment & Plan: - cmp, cbc, A1c, lipid panel, hiv, hep c - next visit: discuss cancer screenings    Psoriasis Assessment & Plan: Clobetaso ointment bid x 4 weeks on lesions.   F/u at next visit.  If not improving consider biopsy and/or referral to dermatology   Alcohol dependence with unspecified alcohol-induced disorder [facility]) Assessment & Plan: Daily 6-12 beers.  No interest in quitting at this time.  - f/u cmp    Other orders -     Clobetasol Propionate; Apply 1 Application topically 2 (two) times daily.  Dispense: 30 g; Refill: 2    Return in about 4 weeks (around 04/21/2023) for rash.    Sandre Kitty, MD

## 2023-03-24 NOTE — Assessment & Plan Note (Signed)
-   cmp, cbc, A1c, lipid panel, hiv, hep c - next visit: discuss cancer screenings

## 2023-03-24 NOTE — Assessment & Plan Note (Signed)
Ear lavage with nurse visit Audiology referral

## 2023-03-24 NOTE — Assessment & Plan Note (Signed)
Clobetaso ointment bid x 4 weeks on lesions.   F/u at next visit.  If not improving consider biopsy and/or referral to dermatology

## 2023-03-24 NOTE — Patient Instructions (Signed)
It was nice to see you today,  We addressed the following topics today: - we will send in a referral to the audiologist to check your hearing.   - we will send in a prescription for topical steroid to put on your rash.  Use this twice a day for the next 4 weeks until you see me again.   - schedule a lab visit for sometime this week or next in the morning.     Have a great day,  Frederic Jericho, MD

## 2023-03-24 NOTE — Assessment & Plan Note (Signed)
Daily 6-12 beers.  No interest in quitting at this time.  - f/u cmp

## 2023-03-24 NOTE — Assessment & Plan Note (Signed)
Discuss low dose annual ct at next visit.   Continue to encourage cessation

## 2023-03-28 ENCOUNTER — Ambulatory Visit (INDEPENDENT_AMBULATORY_CARE_PROVIDER_SITE_OTHER): Payer: Medicare PPO | Admitting: Family Medicine

## 2023-03-28 ENCOUNTER — Other Ambulatory Visit: Payer: Medicare PPO

## 2023-03-28 DIAGNOSIS — Z1159 Encounter for screening for other viral diseases: Secondary | ICD-10-CM

## 2023-03-28 DIAGNOSIS — H6123 Impacted cerumen, bilateral: Secondary | ICD-10-CM | POA: Diagnosis not present

## 2023-03-28 DIAGNOSIS — Z114 Encounter for screening for human immunodeficiency virus [HIV]: Secondary | ICD-10-CM

## 2023-03-28 DIAGNOSIS — F1010 Alcohol abuse, uncomplicated: Secondary | ICD-10-CM

## 2023-03-28 DIAGNOSIS — Z13220 Encounter for screening for lipoid disorders: Secondary | ICD-10-CM

## 2023-03-28 DIAGNOSIS — Z720 Tobacco use: Secondary | ICD-10-CM

## 2023-03-28 NOTE — Progress Notes (Signed)
Bilateral ear lavage. Impaction worse on left side. Impaction removed in right ear. Patient instructed to purchase ear drops for left ear and return for nurse visit.

## 2023-03-29 LAB — CBC WITH DIFFERENTIAL

## 2023-03-29 LAB — LIPID PANEL
Cholesterol, Total: 190 mg/dL (ref 100–199)
Triglycerides: 41 mg/dL (ref 0–149)

## 2023-03-29 LAB — COMPREHENSIVE METABOLIC PANEL
Alkaline Phosphatase: 90 IU/L (ref 44–121)
BUN/Creatinine Ratio: 8 — ABNORMAL LOW (ref 10–24)
CO2: 22 mmol/L (ref 20–29)
Calcium: 9.3 mg/dL (ref 8.6–10.2)
Globulin, Total: 3.4 g/dL (ref 1.5–4.5)
Sodium: 129 mmol/L — ABNORMAL LOW (ref 134–144)
eGFR: 103 mL/min/{1.73_m2} (ref >59)

## 2023-03-31 LAB — CBC WITH DIFFERENTIAL
Basophils Absolute: 0 10*3/uL (ref 0.0–0.2)
Basos: 1 %
EOS (ABSOLUTE): 0.1 10*3/uL (ref 0.0–0.4)
Eos: 2 %
Hemoglobin: 13 g/dL (ref 13.0–17.7)
Lymphs: 27 %
MCH: 34.3 pg — ABNORMAL HIGH (ref 26.6–33.0)
MCHC: 34.7 g/dL (ref 31.5–35.7)
MCV: 99 fL — ABNORMAL HIGH (ref 79–97)
Monocytes Absolute: 1.1 10*3/uL — ABNORMAL HIGH (ref 0.1–0.9)
Monocytes: 28 %
Neutrophils Absolute: 1.7 10*3/uL (ref 1.4–7.0)
Neutrophils: 42 %
RBC: 3.79 x10E6/uL — ABNORMAL LOW (ref 4.14–5.80)
WBC: 3.9 10*3/uL (ref 3.4–10.8)

## 2023-03-31 LAB — COMPREHENSIVE METABOLIC PANEL
ALT: 49 IU/L — ABNORMAL HIGH (ref 0–44)
AST: 70 IU/L — ABNORMAL HIGH (ref 0–40)
Albumin: 4.5 g/dL (ref 3.9–4.9)
BUN: 5 mg/dL — ABNORMAL LOW (ref 8–27)
Bilirubin Total: 0.5 mg/dL (ref 0.0–1.2)
Chloride: 91 mmol/L — ABNORMAL LOW (ref 96–106)
Creatinine, Ser: 0.63 mg/dL — ABNORMAL LOW (ref 0.76–1.27)
Glucose: 80 mg/dL (ref 70–99)
Potassium: 3.9 mmol/L (ref 3.5–5.2)
Total Protein: 7.9 g/dL (ref 6.0–8.5)

## 2023-03-31 LAB — HEMOGLOBIN A1C
Est. average glucose Bld gHb Est-mCnc: 114 mg/dL
Hgb A1c MFr Bld: 5.6 % (ref 4.8–5.6)

## 2023-03-31 LAB — LIPID PANEL
Chol/HDL Ratio: 2 ratio (ref 0.0–5.0)
HDL: 96 mg/dL (ref >39)
LDL Chol Calc (NIH): 86 mg/dL (ref 0–99)
VLDL Cholesterol Cal: 8 mg/dL (ref 5–40)

## 2023-03-31 LAB — HEPATITIS C ANTIBODY: Hep C Virus Ab: NONREACTIVE

## 2023-03-31 LAB — HIV ANTIBODY (ROUTINE TESTING W REFLEX): HIV Screen 4th Generation wRfx: NONREACTIVE

## 2023-04-21 ENCOUNTER — Other Ambulatory Visit: Payer: Self-pay | Admitting: Family Medicine

## 2023-04-24 ENCOUNTER — Ambulatory Visit (INDEPENDENT_AMBULATORY_CARE_PROVIDER_SITE_OTHER): Payer: Medicare PPO | Admitting: Family Medicine

## 2023-04-24 ENCOUNTER — Encounter: Payer: Self-pay | Admitting: Family Medicine

## 2023-04-24 VITALS — BP 120/76 | HR 80 | Ht 67.5 in | Wt 116.1 lb

## 2023-04-24 DIAGNOSIS — L409 Psoriasis, unspecified: Secondary | ICD-10-CM | POA: Diagnosis not present

## 2023-04-24 DIAGNOSIS — F1029 Alcohol dependence with unspecified alcohol-induced disorder: Secondary | ICD-10-CM | POA: Diagnosis not present

## 2023-04-24 DIAGNOSIS — Z1211 Encounter for screening for malignant neoplasm of colon: Secondary | ICD-10-CM

## 2023-04-24 DIAGNOSIS — F1010 Alcohol abuse, uncomplicated: Secondary | ICD-10-CM

## 2023-04-24 DIAGNOSIS — Z720 Tobacco use: Secondary | ICD-10-CM

## 2023-04-24 DIAGNOSIS — H900 Conductive hearing loss, bilateral: Secondary | ICD-10-CM | POA: Diagnosis not present

## 2023-04-24 MED ORDER — ASPIRIN 81 MG PO TBEC: 81.0000 mg | DELAYED_RELEASE_TABLET | Freq: Every day | ORAL | Status: AC

## 2023-04-24 MED ORDER — CLOBETASOL PROPIONATE 0.05 % EX OINT: TOPICAL_OINTMENT | Freq: Two times a day (BID) | CUTANEOUS | 1 refills | Status: AC

## 2023-04-24 NOTE — Progress Notes (Unsigned)
   Established Patient Office Visit  Subjective   Patient ID: Kevin Shea, male    DOB: 09-Sep-1951  Age: 72 y.o. MRN: [id number]  Chief Complaint  Patient presents with   Medical Management of Chronic Issues    HPI Rash -patient states rash is much better now has been putting ointment on it at least once a day usually twice a day.  Does not put it on the back is much as the extremities as it is harder to reach.  Still not having any itching like before on the back.  Hearing-patient states his hearing is much better after having his earwax removed.  Alcohol use-patient states he still drinks alcohol every day.  Usually a sixpack of beer.  Usually starts before noon.  Occasionally will drink liquor as well but mostly just beer.  We discussed his liver enzymes being elevated.  We discussed further workup of his liver.  We discussed how alcohol use progresses to cirrhosis and liver failure.  Cardiovascular disease-discussed starting baby aspirin and a statin with the patient.  States that he is will do what ever I think is best for his health.  Has not had any new complaints of episodes of weakness or slurring since our last visit.  We discussed that he has likely had strokes in the past.    The 10-year ASCVD risk score (Arnett DK, et al., 2019) is: 13.6%   {History (Optional):23778}  ROS    Objective:     BP 120/76   Pulse 80   Ht 5' 7.5" (1.715 m)   Wt 116 lb 1.9 oz (52.7 kg)   SpO2 98%   BMI 17.92 kg/m  {Vitals History (Optional):23777}  Physical Exam General: Alert and oriented CV: Regular rate and rhythm Pulmonary: Lungs clear bilaterally Skin: Improving psoriasis plaques on the extremities and trunk.       No results found for any visits on 04/24/23.  {Labs (Optional):23779}      Assessment & Plan:   Alcohol abuse Assessment & Plan: Repeat CMP.  AST and ALT elevated on last visit - Order for liver elastography - Holding off on statin until liver  workup is complete  Orders: -     Comprehensive metabolic panel -     [place] ELASTOGRAPHY LIVER; Future  Tobacco use Assessment & Plan: Low-dose CT scan ordered.  Orders: -     CT CHEST LUNG CANCER SCREENING LOW DOSE WO CONTRAST; Future  Colon cancer screening -     Ambulatory referral to Gastroenterology  Conductive hearing loss, bilateral Assessment & Plan: Improved after ear lavage.  Patient has not gone to audiology appointment.   Alcohol dependence with unspecified alcohol-induced disorder [facility]) Assessment & Plan: Still drinks quite heavily.  Counseled on cessation. Continue hepatic workup   Psoriasis Assessment & Plan: Improving.  Encourage patient to continue to use steroid cream for another 2 weeks and then switch to twice weekly for maintenance.   Other orders -     Clobetasol Propionate; Apply topically 2 (two) times daily.  Dispense: 60 g; Refill: 1 -     Aspirin; Take 1 tablet (81 mg total) by mouth daily. Swallow whole.     Return in about 2 months (around 06/25/2023) for rash, liver.    Sandre Kitty, MD

## 2023-04-24 NOTE — Patient Instructions (Signed)
It was nice to see you today,  We addressed the following topics today: - take 81mg  aspirin daily - use your ointment twice a day for the next 2 weeks and then use it twice a week after that - I will order a liver elastography ultrasound to test your liver function - I will order a colonoscopy and a ct scan of your chest to screen for cancers.  Someone should call you to set these up.     Have a great day,  Frederic Jericho, MD

## 2023-04-25 NOTE — Assessment & Plan Note (Signed)
Low dose CT scan ordered

## 2023-04-25 NOTE — Assessment & Plan Note (Signed)
Repeat CMP.  AST and ALT elevated on last visit - Order for liver elastography - Holding off on statin until liver workup is complete

## 2023-04-25 NOTE — Assessment & Plan Note (Signed)
Still drinks quite heavily.  Counseled on cessation. Continue hepatic workup

## 2023-04-25 NOTE — Assessment & Plan Note (Signed)
Improved after ear lavage.  Patient has not gone to audiology appointment.

## 2023-04-25 NOTE — Assessment & Plan Note (Signed)
Improving.  Encourage patient to continue to use steroid cream for another 2 weeks and then switch to twice weekly for maintenance.

## 2023-06-30 ENCOUNTER — Ambulatory Visit: Payer: Medicare PPO | Admitting: Family Medicine

## 2023-07-04 ENCOUNTER — Other Ambulatory Visit: Payer: Self-pay | Admitting: Family Medicine

## 2023-07-04 DIAGNOSIS — Z1211 Encounter for screening for malignant neoplasm of colon: Secondary | ICD-10-CM

## 2023-07-04 DIAGNOSIS — Z1212 Encounter for screening for malignant neoplasm of rectum: Secondary | ICD-10-CM

## 2024-07-05 ENCOUNTER — Other Ambulatory Visit: Payer: Self-pay

## 2024-07-05 DIAGNOSIS — Z1212 Encounter for screening for malignant neoplasm of rectum: Secondary | ICD-10-CM

## 2024-07-05 DIAGNOSIS — Z1211 Encounter for screening for malignant neoplasm of colon: Secondary | ICD-10-CM
